# Patient Record
Sex: Female | Born: 1950 | Race: White | Hispanic: No | Marital: Married | Smoking: Never smoker
Health system: Southern US, Community
[De-identification: ages and names within clinical notes are randomized; demographics above are authoritative.]

## PROBLEM LIST (undated history)

## (undated) DIAGNOSIS — M1990 Unspecified osteoarthritis, unspecified site: Secondary | ICD-10-CM

## (undated) DIAGNOSIS — F32A Depression, unspecified: Secondary | ICD-10-CM

## (undated) DIAGNOSIS — F329 Major depressive disorder, single episode, unspecified: Secondary | ICD-10-CM

## (undated) DIAGNOSIS — D649 Anemia, unspecified: Secondary | ICD-10-CM

## (undated) DIAGNOSIS — I639 Cerebral infarction, unspecified: Secondary | ICD-10-CM

## (undated) DIAGNOSIS — R569 Unspecified convulsions: Secondary | ICD-10-CM

## (undated) DIAGNOSIS — K219 Gastro-esophageal reflux disease without esophagitis: Secondary | ICD-10-CM

## (undated) HISTORY — DX: Anemia, unspecified: D64.9

## (undated) HISTORY — DX: Depression, unspecified: F32.A

## (undated) HISTORY — DX: Cerebral infarction, unspecified: I63.9

## (undated) HISTORY — DX: Unspecified convulsions: R56.9

## (undated) HISTORY — DX: Gastro-esophageal reflux disease without esophagitis: K21.9

---
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
## 1998-05-11 ENCOUNTER — Ambulatory Visit (HOSPITAL_COMMUNITY): Admission: RE | Admit: 1998-05-11 | Discharge: 1998-05-11 | Payer: Self-pay | Admitting: Internal Medicine

## 1999-08-25 ENCOUNTER — Encounter: Payer: Self-pay | Admitting: Obstetrics and Gynecology

## 1999-08-25 ENCOUNTER — Encounter: Admission: RE | Admit: 1999-08-25 | Discharge: 1999-08-25 | Payer: Self-pay | Admitting: Obstetrics and Gynecology

## 1999-11-09 ENCOUNTER — Ambulatory Visit (HOSPITAL_COMMUNITY): Admission: RE | Admit: 1999-11-09 | Discharge: 1999-11-09 | Payer: Self-pay | Admitting: Obstetrics and Gynecology

## 2000-08-29 ENCOUNTER — Encounter: Admission: RE | Admit: 2000-08-29 | Discharge: 2000-08-29 | Payer: Self-pay | Admitting: Obstetrics and Gynecology

## 2000-08-29 ENCOUNTER — Encounter: Payer: Self-pay | Admitting: Obstetrics and Gynecology

## 2001-09-04 ENCOUNTER — Encounter: Admission: RE | Admit: 2001-09-04 | Discharge: 2001-09-04 | Payer: Self-pay | Admitting: Obstetrics and Gynecology

## 2001-09-04 ENCOUNTER — Encounter: Payer: Self-pay | Admitting: Obstetrics and Gynecology

## 2001-10-24 ENCOUNTER — Other Ambulatory Visit: Admission: RE | Admit: 2001-10-24 | Discharge: 2001-10-24 | Payer: Self-pay | Admitting: Obstetrics and Gynecology

## 2002-01-06 ENCOUNTER — Encounter: Admission: RE | Admit: 2002-01-06 | Discharge: 2002-04-06 | Payer: Self-pay | Admitting: Internal Medicine

## 2002-09-05 ENCOUNTER — Encounter: Payer: Self-pay | Admitting: Obstetrics and Gynecology

## 2002-09-05 ENCOUNTER — Encounter: Admission: RE | Admit: 2002-09-05 | Discharge: 2002-09-05 | Payer: Self-pay | Admitting: Obstetrics and Gynecology

## 2003-09-07 ENCOUNTER — Encounter: Admission: RE | Admit: 2003-09-07 | Discharge: 2003-09-07 | Payer: Self-pay | Admitting: Obstetrics and Gynecology

## 2004-09-20 ENCOUNTER — Encounter: Admission: RE | Admit: 2004-09-20 | Discharge: 2004-09-20 | Payer: Self-pay | Admitting: Obstetrics and Gynecology

## 2004-09-28 ENCOUNTER — Ambulatory Visit: Payer: Self-pay | Admitting: Internal Medicine

## 2004-10-07 ENCOUNTER — Ambulatory Visit: Payer: Self-pay | Admitting: Internal Medicine

## 2005-01-02 ENCOUNTER — Ambulatory Visit: Payer: Self-pay | Admitting: Internal Medicine

## 2005-09-21 ENCOUNTER — Encounter: Admission: RE | Admit: 2005-09-21 | Discharge: 2005-09-21 | Payer: Self-pay | Admitting: Obstetrics and Gynecology

## 2006-09-24 ENCOUNTER — Encounter: Admission: RE | Admit: 2006-09-24 | Discharge: 2006-09-24 | Payer: Self-pay | Admitting: Obstetrics and Gynecology

## 2007-10-18 ENCOUNTER — Encounter: Admission: RE | Admit: 2007-10-18 | Discharge: 2007-10-18 | Payer: Self-pay | Admitting: Obstetrics and Gynecology

## 2007-11-20 ENCOUNTER — Telehealth: Payer: Self-pay | Admitting: Internal Medicine

## 2007-11-28 ENCOUNTER — Telehealth (INDEPENDENT_AMBULATORY_CARE_PROVIDER_SITE_OTHER): Payer: Self-pay | Admitting: *Deleted

## 2007-12-02 ENCOUNTER — Ambulatory Visit: Payer: Self-pay | Admitting: Internal Medicine

## 2007-12-02 DIAGNOSIS — R635 Abnormal weight gain: Secondary | ICD-10-CM | POA: Insufficient documentation

## 2007-12-03 ENCOUNTER — Ambulatory Visit: Payer: Self-pay | Admitting: Internal Medicine

## 2007-12-05 LAB — CONVERTED CEMR LAB
ALT: 23 units/L (ref 0–35)
AST: 26 units/L (ref 0–37)
Albumin: 4.2 g/dL (ref 3.5–5.2)
Alkaline Phosphatase: 51 units/L (ref 39–117)
Basophils Absolute: 0 10*3/uL (ref 0.0–0.1)
Basophils Relative: 0.7 % (ref 0.0–1.0)
Bilirubin Urine: NEGATIVE
CO2: 31 meq/L (ref 19–32)
Eosinophils Absolute: 0.1 10*3/uL (ref 0.0–0.6)
Eosinophils Relative: 2.9 % (ref 0.0–5.0)
GFR calc non Af Amer: 91 mL/min
HDL: 74 mg/dL (ref >39.0)
Hemoglobin: 13.4 g/dL (ref 12.0–15.0)
LDL Cholesterol: 105 mg/dL — ABNORMAL HIGH (ref 0–99)
Lymphocytes Relative: 39 % (ref 12.0–46.0)
MCHC: 33.9 g/dL (ref 30.0–36.0)
Monocytes Absolute: 0.5 10*3/uL (ref 0.2–0.7)
Monocytes Relative: 9.6 % (ref 3.0–11.0)
Platelets: 258 10*3/uL (ref 150–400)
Potassium: 4.3 meq/L (ref 3.5–5.1)
TSH: 1.81 microintl units/mL (ref 0.35–5.50)
Total Bilirubin: 0.6 mg/dL (ref 0.3–1.2)
Total CHOL/HDL Ratio: 2.5
Total Protein: 6.6 g/dL (ref 6.0–8.3)
Triglycerides: 42 mg/dL (ref 0–149)
Urobilinogen, UA: 0.2 (ref 0.0–1.0)
VLDL: 8 mg/dL (ref 0–40)

## 2007-12-11 ENCOUNTER — Ambulatory Visit: Payer: Self-pay | Admitting: Gastroenterology

## 2007-12-15 HISTORY — PX: COLONOSCOPY: SHX174

## 2007-12-23 ENCOUNTER — Ambulatory Visit: Payer: Self-pay | Admitting: Gastroenterology

## 2007-12-23 DIAGNOSIS — K5730 Diverticulosis of large intestine without perforation or abscess without bleeding: Secondary | ICD-10-CM | POA: Insufficient documentation

## 2008-04-09 ENCOUNTER — Telehealth: Payer: Self-pay | Admitting: Internal Medicine

## 2008-04-10 ENCOUNTER — Ambulatory Visit: Payer: Self-pay | Admitting: Internal Medicine

## 2008-04-10 DIAGNOSIS — R5383 Other fatigue: Secondary | ICD-10-CM

## 2008-04-10 DIAGNOSIS — R5381 Other malaise: Secondary | ICD-10-CM | POA: Insufficient documentation

## 2008-04-10 DIAGNOSIS — G4700 Insomnia, unspecified: Secondary | ICD-10-CM | POA: Insufficient documentation

## 2008-07-27 ENCOUNTER — Ambulatory Visit: Payer: Self-pay | Admitting: Internal Medicine

## 2008-07-28 ENCOUNTER — Telehealth (INDEPENDENT_AMBULATORY_CARE_PROVIDER_SITE_OTHER): Payer: Self-pay | Admitting: *Deleted

## 2008-08-18 ENCOUNTER — Telehealth: Payer: Self-pay | Admitting: Internal Medicine

## 2008-08-31 ENCOUNTER — Ambulatory Visit: Payer: Self-pay | Admitting: Gastroenterology

## 2008-09-25 ENCOUNTER — Ambulatory Visit: Payer: Self-pay | Admitting: Gastroenterology

## 2008-09-25 ENCOUNTER — Encounter: Payer: Self-pay | Admitting: Gastroenterology

## 2008-10-01 ENCOUNTER — Encounter: Payer: Self-pay | Admitting: Gastroenterology

## 2008-10-02 ENCOUNTER — Encounter: Payer: Self-pay | Admitting: Gastroenterology

## 2008-10-12 ENCOUNTER — Ambulatory Visit: Payer: Self-pay | Admitting: Internal Medicine

## 2008-10-12 DIAGNOSIS — J019 Acute sinusitis, unspecified: Secondary | ICD-10-CM

## 2008-10-12 DIAGNOSIS — B009 Herpesviral infection, unspecified: Secondary | ICD-10-CM | POA: Insufficient documentation

## 2008-10-27 ENCOUNTER — Encounter: Admission: RE | Admit: 2008-10-27 | Discharge: 2008-10-27 | Payer: Self-pay | Admitting: Obstetrics and Gynecology

## 2008-11-18 ENCOUNTER — Telehealth: Payer: Self-pay | Admitting: Internal Medicine

## 2009-01-21 ENCOUNTER — Ambulatory Visit: Payer: Self-pay | Admitting: Gastroenterology

## 2009-01-21 ENCOUNTER — Ambulatory Visit: Payer: Self-pay | Admitting: Internal Medicine

## 2009-01-21 DIAGNOSIS — R195 Other fecal abnormalities: Secondary | ICD-10-CM

## 2009-01-21 DIAGNOSIS — K222 Esophageal obstruction: Secondary | ICD-10-CM

## 2009-01-21 LAB — CONVERTED CEMR LAB
Basophils Absolute: 0 10*3/uL (ref 0.0–0.1)
Eosinophils Absolute: 0.1 10*3/uL (ref 0.0–0.7)
GFR calc non Af Amer: 77.91 mL/min (ref >60)
Glucose, Bld: 108 mg/dL — ABNORMAL HIGH (ref 70–99)
Ketones, ur: NEGATIVE mg/dL
Leukocytes, UA: NEGATIVE
Lymphs Abs: 1.2 10*3/uL (ref 0.7–4.0)
MCHC: 33.9 g/dL (ref 30.0–36.0)
MCV: 95.5 fL (ref 78.0–100.0)
Monocytes Absolute: 0.4 10*3/uL (ref 0.1–1.0)
Platelets: 259 10*3/uL (ref 150.0–400.0)
Potassium: 4.6 meq/L (ref 3.5–5.1)
RDW: 12.5 % (ref 11.5–14.6)
Sodium: 143 meq/L (ref 135–145)
Specific Gravity, Urine: <=1.005 (ref 1.000–1.030)
Total Protein, Urine: NEGATIVE mg/dL
Urobilinogen, UA: 0.2 (ref 0.0–1.0)
WBC: 5.4 10*3/uL (ref 4.5–10.5)

## 2009-01-25 ENCOUNTER — Ambulatory Visit: Payer: Self-pay | Admitting: Internal Medicine

## 2009-01-25 DIAGNOSIS — L259 Unspecified contact dermatitis, unspecified cause: Secondary | ICD-10-CM | POA: Insufficient documentation

## 2009-03-24 ENCOUNTER — Telehealth: Payer: Self-pay | Admitting: Gastroenterology

## 2009-04-08 ENCOUNTER — Ambulatory Visit: Payer: Self-pay | Admitting: Internal Medicine

## 2009-04-08 DIAGNOSIS — J309 Allergic rhinitis, unspecified: Secondary | ICD-10-CM | POA: Insufficient documentation

## 2009-04-20 ENCOUNTER — Telehealth: Payer: Self-pay | Admitting: Internal Medicine

## 2009-04-20 ENCOUNTER — Encounter: Payer: Self-pay | Admitting: Internal Medicine

## 2009-04-22 ENCOUNTER — Encounter: Payer: Self-pay | Admitting: Gastroenterology

## 2009-06-09 ENCOUNTER — Encounter: Payer: Self-pay | Admitting: Internal Medicine

## 2009-07-14 ENCOUNTER — Encounter: Payer: Self-pay | Admitting: Internal Medicine

## 2009-10-05 ENCOUNTER — Telehealth (INDEPENDENT_AMBULATORY_CARE_PROVIDER_SITE_OTHER): Payer: Self-pay | Admitting: *Deleted

## 2009-10-28 ENCOUNTER — Encounter: Admission: RE | Admit: 2009-10-28 | Discharge: 2009-10-28 | Payer: Self-pay | Admitting: Obstetrics and Gynecology

## 2010-02-01 ENCOUNTER — Ambulatory Visit: Payer: Self-pay | Admitting: Internal Medicine

## 2010-02-01 DIAGNOSIS — F329 Major depressive disorder, single episode, unspecified: Secondary | ICD-10-CM

## 2010-02-01 DIAGNOSIS — M545 Low back pain: Secondary | ICD-10-CM

## 2010-04-21 ENCOUNTER — Ambulatory Visit: Payer: Self-pay | Admitting: Internal Medicine

## 2010-04-21 DIAGNOSIS — IMO0002 Reserved for concepts with insufficient information to code with codable children: Secondary | ICD-10-CM | POA: Insufficient documentation

## 2010-04-28 ENCOUNTER — Telehealth: Payer: Self-pay | Admitting: Internal Medicine

## 2010-08-30 ENCOUNTER — Encounter (INDEPENDENT_AMBULATORY_CARE_PROVIDER_SITE_OTHER): Payer: Self-pay | Admitting: *Deleted

## 2010-08-30 ENCOUNTER — Telehealth: Payer: Self-pay | Admitting: Internal Medicine

## 2010-10-05 ENCOUNTER — Ambulatory Visit: Payer: Self-pay | Admitting: Internal Medicine

## 2010-10-05 DIAGNOSIS — J069 Acute upper respiratory infection, unspecified: Secondary | ICD-10-CM | POA: Insufficient documentation

## 2010-10-07 ENCOUNTER — Telehealth: Payer: Self-pay | Admitting: Internal Medicine

## 2010-10-07 DIAGNOSIS — K219 Gastro-esophageal reflux disease without esophagitis: Secondary | ICD-10-CM | POA: Insufficient documentation

## 2010-10-07 DIAGNOSIS — R110 Nausea: Secondary | ICD-10-CM

## 2010-10-13 ENCOUNTER — Encounter
Admission: RE | Admit: 2010-10-13 | Discharge: 2010-10-13 | Payer: Self-pay | Source: Home / Self Care | Attending: Internal Medicine | Admitting: Internal Medicine

## 2010-10-19 ENCOUNTER — Telehealth: Payer: Self-pay | Admitting: Internal Medicine

## 2010-11-01 ENCOUNTER — Encounter
Admission: RE | Admit: 2010-11-01 | Discharge: 2010-11-01 | Payer: Self-pay | Source: Home / Self Care | Attending: Obstetrics and Gynecology | Admitting: Obstetrics and Gynecology

## 2010-11-17 NOTE — Progress Notes (Signed)
Summary: Cough med  Phone Note Other Incoming   Caller: pt  Summary of Call: Pt was seen on 12/21, she states she declined cough medication at that time but thinks that is what she needs. She states she needs something without codeine [facility] [address]. Please Advise Initial call taken by: Ami Bullins CMA,  October 19, 2010 1:18 PM  Follow-up for Phone Call        per AVP: called in Tusionex  take 5ml by mouth two times a day as needed  # with No Rf. Follow-up by: Lanier Prude, [facility]),  October 19, 2010 4:20 PM    New/Updated Medications: Sandria Senter ER 10-8 MG/5ML LQCR (HYDROCOD POLST-CHLORPHEN POLST) take 5 ml by mouth two times a day as needed cough Prescriptions: TUSSIONEX PENNKINETIC ER 10-8 MG/5ML LQCR (HYDROCOD POLST-CHLORPHEN POLST) take 5 ml by mouth two times a day as needed cough  #117ml x 0   Entered by:   Lanier Prude, CMA(AAMA)   Authorized by:   Tresa Garter MD   Signed by:   Lanier Prude, CMA(AAMA) on 10/19/2010   Method used:   [facility]phoned to ...       [facility]  [address] [id number]       [address]       [place], [place]  [address]       Ph: [phone] or [phone]       Fax: [phone]   RxID:   [id number]

## 2010-11-17 NOTE — Letter (Signed)
Summary: Generic Letter  [facility]  [address] [place], [place] [address]   Phone: [phone]  Fax: [phone]    04/21/2010  [facility] [address] [place], [place]  [address]  Dear Ms. Venkatesh,      Due to your recent injury to the right knee on Sunday  April 17, 2010,  you should work from home April 18, 2010 through  April 24, 2010, with return to the workplace April 25, 2010         Sincerely,   Oliver Barre MD

## 2010-11-17 NOTE — Assessment & Plan Note (Signed)
Summary: HURT BACK X 2 DYS AGO--STC   Vital Signs:  Patient profile:   60 year old female Height:      62 inches Weight:      180 pounds BMI:     33.04 O2 Sat:      95 % on Room air Temp:     98.3 degrees F oral Pulse rate:   79 / minute BP sitting:   130 / 72  (left arm) Cuff size:   regular  Vitals Entered By: Lucious Groves (February 01, 2010 2:09 PM)  O2 Flow:  Room air CC: Back pain x2 days, pt is in process of remodeling her home./kb Is Patient Diabetic? No Pain Assessment Patient in pain? yes     Location: back Intensity: 6 Type: weakness   Primary Care Provider:  Sonda Primes, MD  CC:  Back pain x2 days and pt is in process of remodeling her home./kb.  History of Present Illness: C/o depression and wt gain F/u allergies C/o LBP  Allergies (verified): No Known Drug Allergies  Past History:  Social History: Last updated: 01/21/2009 Occupation: [employer] Married Never Smoked Alcohol Use - yes -1 daily Illicit Drug Use - no Daily Caffeine Use Patient does not get regular exercise. - 2 cups daily  Past Medical History: Current Problems:  DIVERTICULOSIS, COLON (ICD-562.10) DYSPHAGIA UNSPECIFIED (ICD-787.20) INSOMNIA, PERSISTENT (ICD-307.42) FATIGUE (ICD-780.79) WELL ADULT EXAM (ICD-V70.0) WEIGHT GAIN (ICD-783.1) Depression Low back pain 2011  Family History: Aunt breast ca M kidney CA 73 No FH of Colon Cancer: Maternal grandmother-Kidney Cancer in [place]  Social History: Reviewed history from 01/21/2009 and no changes required. Occupation: [employer] Married Never Smoked Alcohol Use - yes -1 daily Illicit Drug Use - no Daily Caffeine Use Patient does not get regular exercise. - 2 cups daily  Physical Exam  General:  alert, well-developed, well-nourished, and cooperative to examination.    Ears:  normal pinnae bilaterally, without erythema, swelling, or tenderness to palpation. TMs clear, R without effusion, L with serous effusion. No  erythema, and no cerumen impaction. Hearing grossly normal bilaterally  Nose:  no nasal discharge, mucosal pallor or mucosal erythema.   Mouth:  teeth and gums in good repair; mucous membranes moist, without lesions or ulcers. oropharynx clear without exudate, erythema. postnasal drip noted Abdomen:  soft non-tender, normal BS,no distention, no organomegalies, no masses Msk:  No deformity or scoliosis noted of thoracic or lumbar spine.   Extremities:  no edema, no ulcers  Neurologic:  alert & oriented X3 and cranial nerves II-XII symetrically intact.  strength normal in all extremities, sensation intact to light touch, and gait normal. speech fluent without dysarthria or aphasia; follows commands with good comprehension.  Skin:  very tan from recent sun exposure. No generalized rash or hives Psych:  Oriented X3, memory intact for recent and remote, normally interactive, good eye contact, not anxious appearing, not depressed appearing, and not agitated.   not suicidal.     Impression & Recommendations:  Problem # 1:  WEIGHT GAIN (ICD-783.1) Assessment Deteriorated Diet discussed  Problem # 2:  FATIGUE (ICD-780.79) due to #3 Assessment: Deteriorated  Problem # 3:  DEPRESSION (ICD-311) Assessment: Deteriorated  The following medications were removed from the medication list:    Fluoxetine Hcl 10 Mg Tabs (Fluoxetine hcl) .Marland Kitchen... Take 1 tablet by mouth once a day Her updated medication list for this problem includes:    Wellbutrin Sr 150 Mg Xr12h-tab (Bupropion hcl) .Marland Kitchen... 1 by mouth q am and  q lunch (two times a day)  Problem # 4:  LOW BACK PAIN (ICD-724.2) Assessment: Deteriorated  Her updated medication list for this problem includes:    Ibuprofen 600 Mg Tabs (Ibuprofen) .Marland Kitchen... 1 by mouth bid  pc x 1 wk then as needed for  pain    Tramadol Hcl 50 Mg Tabs (Tramadol hcl) .Marland Kitchen... 1-2 tabs by mouth two times a day as needed pain  Complete Medication List: 1)  Caltrate 600 1500 Mg Tabs  (Calcium carbonate) .... Take 1 tablet by mouth once a day 2)  Pantoprazole Sodium 40 Mg Tbec (Pantoprazole sodium) .... One tablet by mouth once daily 3)  Topicort 0.25 % Crea (Desoximetasone) .... Use two times a day as needed 4)  Adipex-p 37.5 Mg Tabs (Phentermine hcl) .Marland Kitchen.. 1 by mouth q am 5)  Nasonex 50 Mcg/act Susp (Mometasone furoate) .Marland Kitchen.. 1 spray each nostril each am 6)  Claritin 10 Mg Tabs (Loratadine) .Marland Kitchen.. 1 by mouth once daily 7)  Tessalon Perles 100 Mg Caps (Benzonatate) .Marland Kitchen.. 1 by mouth three times a day as needed cough 8)  Wellbutrin Sr 150 Mg Xr12h-tab (Bupropion hcl) .Marland Kitchen.. 1 by mouth q am and q lunch (two times a day) 9)  Vitamin D 1000 Unit Tabs (Cholecalciferol) .Marland Kitchen.. 1 by mouth qd 10)  Ibuprofen 600 Mg Tabs (Ibuprofen) .Marland Kitchen.. 1 by mouth bid  pc x 1 wk then as needed for  pain 11)  Tramadol Hcl 50 Mg Tabs (Tramadol hcl) .Marland Kitchen.. 1-2 tabs by mouth two times a day as needed pain  Patient Instructions: 1)  Please schedule a follow-up appointment in 2 months. 2)  Use stretching and balance exercises that I have provided (15 min. or longer every day)  Prescriptions: TRAMADOL HCL 50 MG TABS (TRAMADOL HCL) 1-2 tabs by mouth two times a day as needed pain  #120 x 1   Entered and Authorized by:   Tresa Garter MD   Signed by:   Tresa Garter MD on 02/01/2010   Method used:   [facility]        [facility]  [address] [id number]       [address]       [place], [place]  [address]       Ph: [phone] or [phone]       Fax: [phone]   RxID:   [id number] TRAMADOL HCL 50 MG TABS (TRAMADOL HCL) 1-2 tabs by mouth two times a day as needed pain  #120 x 1   Entered and Authorized by:   Tresa Garter MD   Signed by:   Tresa Garter MD on 02/01/2010   Method used:   Print then Give to Patient   RxID:   [id number] IBUPROFEN 600 MG TABS (IBUPROFEN) 1 by mouth bid  pc x 1 wk then as needed for  pain  #60 x 3   Entered and  Authorized by:   Tresa Garter MD   Signed by:   Tresa Garter MD on 02/01/2010   Method used:   [facility]        [facility]  [address] [id number]       [address]       [place], [place]  [address]       Ph: [phone] or [phone]       Fax: [phone]   RxID:   [id number]  WELLBUTRIN SR 150 MG XR12H-TAB (BUPROPION HCL) 1 by mouth q am and q lunch (two times a day)  #60 x 6   Entered and Authorized by:   Tresa Garter MD   Signed by:   Tresa Garter MD on 02/01/2010   Method used:   [facility]        [facility]  [address] [id number]       [address]       [place], [place]  [address]       Ph: [phone] or [phone]       Fax: [phone]   RxID:   [id number]

## 2010-11-17 NOTE — Assessment & Plan Note (Signed)
Summary: KNEE PAIN/NWS   Vital Signs:  Patient profile:   60 year old female Height:      62 inches Weight:      176.75 pounds BMI:     32.44 O2 Sat:      97 % on Room air Temp:     98.4 degrees F oral Pulse rate:   71 / minute BP sitting:   138 / 88  (left arm) Cuff size:   regular  Vitals Entered By: Zella Ball Ewing CMA [facility]) (April 21, 2010 3:36 PM)  O2 Flow:  Room air CC: Right knee pain/RE   Primary Care Provider:  Sonda Primes, MD  CC:  Right knee pain/RE.  History of Present Illness: here after twisting the right knee after a mistep going down a stair 3 to 4 days ago;  pain is mild to mod, lateral and medial, kneecap feels "funny" like its out of place, not sure about swelling to theknee, but no swelling below the knee;  no sense of instability or further falls or injury, no fever, hx of gout;  worried b/c husband has surgury on his knee;  no  prior surgury , no recent MRI, PT or ortho MD in the past.  Pain worse to standing, walking and hard to sleep at night when turning over.   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 Take 1 Tablet By Mouth Once A Day 2)  Pantoprazole Sodium 40 Mg Tbec (Pantoprazole Sodium) .... One Tablet By Mouth Once Daily 3)  Topicort 0.25 % Crea (Desoximetasone) .... Use Two Times A Day As Needed 4)  Adipex-P 37.5 Mg Tabs  (Phentermine Hcl) .Marland Kitchen.. 1 By Mouth Q Am 5)  Nasonex 50 Mcg/act Susp (Mometasone Furoate) .Marland Kitchen.. 1 Spray Each Nostril Each Am 6)  Claritin 10 Mg Tabs (Loratadine) .Marland Kitchen.. 1 By Mouth Once Daily 7)  Tessalon Perles 100 Mg Caps (Benzonatate) .Marland Kitchen.. 1 By Mouth Three Times A Day As Needed Cough 8)  Wellbutrin Sr 150 Mg Xr12h-Tab (Bupropion Hcl) .Marland Kitchen.. 1 By Mouth Q Am and Q Lunch (Two Times A Day) 9)  Vitamin D 1000 Unit Tabs (Cholecalciferol) .Marland Kitchen.. 1 By Mouth Qd 10)  Ibuprofen 600 Mg Tabs (Ibuprofen) .Marland Kitchen.. 1 By Mouth Bid  Pc X 1 Wk Then As Needed For  Pain 11)  Tramadol Hcl 50 Mg Tabs (Tramadol Hcl) .Marland Kitchen.. 1-2 Tabs By Mouth Two Times A Day As Needed Pain  Current Medications (verified): 1)  Caltrate 600 1500 Mg Tabs (Calcium Carbonate) .... Take 1 Tablet By Mouth Once A Day 2)  Pantoprazole Sodium 40 Mg Tbec (Pantoprazole Sodium) .... One Tablet By Mouth Once Daily 3)  Topicort 0.25 % Crea (Desoximetasone) .... Use Two Times A Day As Needed 4)  Adipex-P 37.5 Mg Tabs (Phentermine Hcl) .Marland Kitchen.. 1 By Mouth Q Am 5)  Nasonex 50 Mcg/act Susp (Mometasone Furoate) .Marland KitchenMarland KitchenMarland Kitchen  1 Spray Each Nostril Each Am 6)  Claritin 10 Mg Tabs (Loratadine) .Marland Kitchen.. 1 By Mouth Once Daily 7)  Tessalon Perles 100 Mg Caps (Benzonatate) .Marland Kitchen.. 1 By Mouth Three Times A Day As Needed Cough 8)  Wellbutrin Sr 150 Mg Xr12h-Tab (Bupropion Hcl) .Marland Kitchen.. 1 By Mouth Q Am and Q Lunch (Two Times A Day) 9)  Vitamin D 1000 Unit Tabs (Cholecalciferol) .Marland Kitchen.. 1 By Mouth Qd 10)  Ibuprofen 600 Mg Tabs (Ibuprofen) .Marland Kitchen.. 1 By Mouth Bid  Pc X 1 Wk Then As Needed For  Pain 11)  Tramadol Hcl 50 Mg Tabs (Tramadol Hcl) .Marland Kitchen.. 1-2 Tabs By Mouth Two Times A Day As Needed Pain  Allergies (verified): No Known Drug Allergies  Past History:  Past Medical History: Last updated: 02/01/2010 Current Problems:  DIVERTICULOSIS, COLON (ICD-562.10) DYSPHAGIA UNSPECIFIED (ICD-787.20) INSOMNIA, PERSISTENT (ICD-307.42) FATIGUE (ICD-780.79) WELL ADULT EXAM (ICD-V70.0) WEIGHT GAIN  (ICD-783.1) Depression Low back pain 2011  Past Surgical History: Last updated: 08/31/2008 Unremarkable  Social History: Last updated: 01/21/2009 Occupation: [employer] Married Never Smoked Alcohol Use - yes -1 daily Illicit Drug Use - no Daily Caffeine Use Patient does not get regular exercise. - 2 cups daily  Risk Factors: Exercise: no (01/21/2009)  Risk Factors: Smoking Status: never (12/02/2007)  Review of Systems       all otherwise negative per pt -    Physical Exam  General:  alert and overweight-appearing.   Head:  normocephalic and atraumatic.   Eyes:  vision grossly intact, pupils equal, and pupils round.   Ears:  R ear normal and L ear normal.   Nose:  no external deformity and no nasal discharge.   Mouth:  no gingival abnormalities and pharynx pink and moist.   Neck:  supple and no masses.   Lungs:  normal respiratory effort and normal breath sounds.   Heart:  normal rate and regular rhythm.   Msk:  right knee with mild medial aspect swelling, tender and warmth, trace warmth laterally as well without tender or swelling;  patella nontender; knee with FROM, NT, but with mild crepitus Extremities:  no edema, no erythema  Neurologic:  strength normal in all extremities and gait normal.     Impression & Recommendations:  Problem # 1:  KNEE SPRAIN, RIGHT (ICD-844.9) with medial puffiness, and o/w mild knee crepitus; ok for note to work from home for 1 wk;  d/w pt - ok to hold on imaging at this time, for ibuprofen as needed, f/u any worsening or persistent pain but should improve over the next 1-2 wks  Complete Medication List: 1)  Caltrate 600 1500 Mg Tabs (Calcium carbonate) .... Take 1 tablet by mouth once a day 2)  Pantoprazole Sodium 40 Mg Tbec (Pantoprazole sodium) .... One tablet by mouth once daily 3)  Topicort 0.25 % Crea (Desoximetasone) .... Use two times a day as needed 4)  Adipex-p 37.5 Mg Tabs (Phentermine hcl) .Marland Kitchen.. 1 by mouth q am 5)   Nasonex 50 Mcg/act Susp (Mometasone furoate) .Marland Kitchen.. 1 spray each nostril each am 6)  Claritin 10 Mg Tabs (Loratadine) .Marland Kitchen.. 1 by mouth once daily 7)  Tessalon Perles 100 Mg Caps (Benzonatate) .Marland Kitchen.. 1 by mouth three times a day as needed cough 8)  Wellbutrin Sr 150 Mg Xr12h-tab (Bupropion hcl) .Marland Kitchen.. 1 by mouth q am and q lunch (two times a day) 9)  Vitamin D 1000 Unit Tabs (Cholecalciferol) .Marland Kitchen.. 1 by mouth qd 10)  Ibuprofen 600 Mg Tabs (Ibuprofen) .Marland KitchenMarland KitchenMarland Kitchen 1  by mouth bid  pc x 1 wk then as needed for  pain 11)  Tramadol Hcl 50 Mg Tabs (Tramadol hcl) .Marland Kitchen.. 1-2 tabs by mouth two times a day as needed pain  Patient Instructions: 1)  Please take all new medications as prescribed 2)  Continue all previous medications as before this visit  3)  you are given the work note today 4)  Please schedule an appointment with your primary doctor as needed Prescriptions: IBUPROFEN 600 MG TABS (IBUPROFEN) 1 by mouth bid  pc x 1 wk then as needed for  pain  #60 x 1   Entered and Authorized by:   Corwin Levins MD   Signed by:   Corwin Levins MD on 04/21/2010   Method used:   Print then Give to Patient   RxID:   [id number]

## 2010-11-17 NOTE — Progress Notes (Signed)
Summary: Rf Fluoxetine  Phone Note Outgoing Call   Call placed by: Misty Stanley Call placed to: Patient Summary of Call: lmctb re: [facility].......rf request for fluoxetine 100mg .  Per pt's med list pt is not taking.  Need to verify with her if she truly needs RF or to disregard. Initial call taken by: Lanier Prude, [facility]),  April 28, 2010 3:37 PM  Follow-up for Phone Call        spoke to pt. She states she is currently taking gen Wellbutrin and does not need this medication. Will disregard fax. Follow-up by: Lanier Prude, [facility]),  April 28, 2010 3:47 PM

## 2010-11-17 NOTE — Progress Notes (Signed)
Summary: u/s?   Phone Note Call from Patient Call back at Home Phone [phone]   Summary of Call: Pt's mother and grandmother both died from kidney cancer. Per pt, mother had symptoms of indigestion (same as pt has) before she was dx w/cancer. Pt's sister thinks she should have a renal u/s. Please advise.  Initial call taken by: Lamar Sprinkles, CMA,  October 07, 2010 1:53 PM  Follow-up for Phone Call        ok abd [place] Follow-up by: Tresa Garter MD,  October 07, 2010 4:25 PM  Additional Follow-up for Phone Call Additional follow up Details #1::        Pt informed  Additional Follow-up by: Lamar Sprinkles, CMA,  October 11, 2010 10:14 AM  New Problems: GERD (ICD-530.81) NAUSEA (ICD-787.02)   New Problems: GERD (ICD-530.81) NAUSEA (ICD-787.02)

## 2010-11-17 NOTE — Assessment & Plan Note (Signed)
Summary: cold,cough/cd   Vital Signs:  Patient profile:   60 year old female Height:      62 inches Weight:      183 pounds BMI:     33.59 Temp:     99.2 degrees F oral Pulse rate:   84 / minute Pulse rhythm:   regular Resp:     16 per minute BP sitting:   136 / 80  (left arm) Cuff size:   regular  Vitals Entered By: Lanier Prude, Beverly Gust) (October 05, 2010 9:43 AM) CC: congestion, Rt ear stopped up, sneezing, fever X 1 day Is Patient Diabetic? No   Primary Care Provider:  Sonda Primes, MD  CC:  congestion, Rt ear stopped up, sneezing, and fever X 1 day.  History of Present Illness: The patient presents with complaints of sore throat, fever, cough, sinus congestion and drainge of several days duration. Not better with OTC meds. Chest hurts with coughing. Can't sleep due to cough. Muscle aches are present.  The mucus is colored.  C/o cold sores  C/o stress  Current Medications (verified): 1)  Caltrate 600 1500 Mg Tabs (Calcium Carbonate) .... Take 1 Tablet By Mouth Once A Day 2)  Pantoprazole Sodium 40 Mg Tbec (Pantoprazole Sodium) .... One Tablet By Mouth Once Daily 3)  Topicort 0.25 % Crea (Desoximetasone) .... Use Two Times A Day As Needed 4)  Adipex-P 37.5 Mg Tabs (Phentermine Hcl) .Marland Kitchen.. 1 By Mouth Q Am 5)  Nasonex 50 Mcg/act Susp (Mometasone Furoate) .Marland Kitchen.. 1 Spray Each Nostril Each Am 6)  Claritin 10 Mg Tabs (Loratadine) .Marland Kitchen.. 1 By Mouth Once Daily 7)  Tessalon Perles 100 Mg Caps (Benzonatate) .Marland Kitchen.. 1 By Mouth Three Times A Day As Needed Cough 8)  Wellbutrin Sr 150 Mg Xr12h-Tab (Bupropion Hcl) .Marland Kitchen.. 1 By Mouth Q Am and Q Lunch (Two Times A Day) 9)  Vitamin D 1000 Unit Tabs (Cholecalciferol) .Marland Kitchen.. 1 By Mouth Qd 10)  Ibuprofen 600 Mg Tabs (Ibuprofen) .Marland Kitchen.. 1 By Mouth Bid  Pc X 1 Wk Then As Needed For  Pain 11)  Tramadol Hcl 50 Mg Tabs (Tramadol Hcl) .Marland Kitchen.. 1-2 Tabs By Mouth Two Times A Day As Needed Pain  Allergies (verified): No Known Drug Allergies  Past  History:  Past Medical History: Last updated: 02/01/2010 Current Problems:  DIVERTICULOSIS, COLON (ICD-562.10) DYSPHAGIA UNSPECIFIED (ICD-787.20) INSOMNIA, PERSISTENT (ICD-307.42) FATIGUE (ICD-780.79) WELL ADULT EXAM (ICD-V70.0) WEIGHT GAIN (ICD-783.1) Depression Low back pain 2011  Social History: Occupation: [employer] Married Never Smoked Alcohol Use - yes -1 daily Illicit Drug Use - no Daily Caffeine Use 2 cups Patient does not get regular exercise  Physical Exam  General:  alert and overweight-appearing.   Ears:  R ear normal and L ear normal.   Nose:  no external deformity and no nasal discharge.   Mouth:  no gingival abnormalities and pharynx pink and moist.   Erythematous throat and intranasal mucosa c/w URI  Lungs:  normal respiratory effort and normal breath sounds.   Heart:  normal rate and regular rhythm.   Abdomen:  soft non-tender, normal BS,no distention, no organomegalies, no masses Msk:  right knee with mild medial aspect swelling, tender and warmth, trace warmth laterally as well without tender or swelling;  patella nontender; knee with FROM, NT, but with mild crepitus Extremities:  no edema, no erythema  Neurologic:  strength normal in all extremities and gait normal.   Skin:  very tan from recent sun exposure. No generalized rash or hives  Psych:  Oriented X3, memory intact for recent and remote, normally interactive, good eye contact, not anxious appearing, not depressed appearing, and not agitated.   not suicidal.     Impression & Recommendations:  Problem # 1:  UPPER RESPIRATORY INFECTION, ACUTE (ICD-465.9) Assessment New  Her updated medication list for this problem includes:    Claritin 10 Mg Tabs (Loratadine) .Marland Kitchen... 1 by mouth once daily    Tessalon Perles 100 Mg Caps (Benzonatate) .Marland Kitchen... 1 by mouth three times a day as needed cough    Ibuprofen 600 Mg Tabs (Ibuprofen) .Marland Kitchen... 1 by mouth bid  pc x 1 wk then as needed for  pain  Problem # 2:   HERPES LABIALIS (ICD-054.9) Assessment: New Acyclovir  Problem # 3:  DEPRESSION (ICD-311) Assessment: Deteriorated Stress discussed Her updated medication list for this problem includes:    Wellbutrin Sr 150 Mg Xr12h-tab (Bupropion hcl) .Marland Kitchen... 1 by mouth q am and q lunch (two times a day)  Problem # 4:  ALLERGIC RHINITIS (ICD-477.9) Assessment: Deteriorated  Her updated medication list for this problem includes:    Nasonex 50 Mcg/act Susp (Mometasone furoate) .Marland Kitchen... 1 spray each nostril each am    Claritin 10 Mg Tabs (Loratadine) .Marland Kitchen... 1 by mouth once daily    Sudafed 12 Hour 120 Mg Xr12h-tab (Pseudoephedrine hcl) .Marland Kitchen... 1 by mouth two times a day as needed allergies  Complete Medication List: 1)  Caltrate 600 1500 Mg Tabs (Calcium carbonate) .... Take 1 tablet by mouth once a day 2)  Pantoprazole Sodium 40 Mg Tbec (Pantoprazole sodium) .... One tablet by mouth once daily 3)  Topicort 0.25 % Crea (Desoximetasone) .... Use two times a day as needed 4)  Adipex-p 37.5 Mg Tabs (Phentermine hcl) .Marland Kitchen.. 1 by mouth q am 5)  Nasonex 50 Mcg/act Susp (Mometasone furoate) .Marland Kitchen.. 1 spray each nostril each am 6)  Claritin 10 Mg Tabs (Loratadine) .Marland Kitchen.. 1 by mouth once daily 7)  Tessalon Perles 100 Mg Caps (Benzonatate) .Marland Kitchen.. 1 by mouth three times a day as needed cough 8)  Wellbutrin Sr 150 Mg Xr12h-tab (Bupropion hcl) .Marland Kitchen.. 1 by mouth q am and q lunch (two times a day) 9)  Vitamin D 1000 Unit Tabs (Cholecalciferol) .Marland Kitchen.. 1 by mouth qd 10)  Ibuprofen 600 Mg Tabs (Ibuprofen) .Marland Kitchen.. 1 by mouth bid  pc x 1 wk then as needed for  pain 11)  Tramadol Hcl 50 Mg Tabs (Tramadol hcl) .Marland Kitchen.. 1-2 tabs by mouth two times a day as needed pain 12)  Zithromax Z-pak 250 Mg Tabs (Azithromycin) .... As dirrected 13)  Acyclovir 400 Mg Tabs (Acyclovir) .Marland Kitchen.. 1 by mouth three times a day as needed herpes x 7 d 14)  Sudafed 12 Hour 120 Mg Xr12h-tab (Pseudoephedrine hcl) .Marland Kitchen.. 1 by mouth two times a day as needed allergies  Patient  Instructions: 1)  Use over-the-counter medicines for "cold": Tylenol  650mg  or Advil 400mg  every 6 hours  for fever; Delsym or Robutussin for cough. Mucinex  for congestion. Ricola or Halls for sore throat. Office visit if not better or if worse.   Prescriptions: SUDAFED 12 HOUR 120 MG XR12H-TAB (PSEUDOEPHEDRINE HCL) 1 by mouth two times a day as needed allergies  #60 x 1   Entered and Authorized by:   Tresa Garter MD   Signed by:   Tresa Garter MD on 10/05/2010   Method used:   Electronically to        [facility]  [address] [id number] (  [facility]       [address]       [place], [place]  [address]       Ph: [phone] or [phone]       Fax: [phone]   RxID:   [id number] ACYCLOVIR 400 MG TABS (ACYCLOVIR) 1 by mouth three times a day as needed herpes x 7 d  #21 x 3   Entered and Authorized by:   Tresa Garter MD   Signed by:   Tresa Garter MD on 10/05/2010   Method used:   [facility]ronically to        [facility]  [address] [id number] ([facility]       [address]       [place], [place]  [address]       Ph: [phone] or [phone]       Fax: [phone]   RxID:   [id number] ZITHROMAX Z-PAK 250 MG TABS (AZITHROMYCIN) as dirrected  #1 x 0   Entered and Authorized by:   Tresa Garter MD   Signed by:   Tresa Garter MD on 10/05/2010   Method used:   [facility]        [facility]  [address] [id number]       [address]       [place], [place]  [address]       Ph: [phone] or [phone]       Fax: [phone]   RxID:   [id number]    Orders Added: 1)  Est. Patient Level IV [[address]]

## 2010-11-17 NOTE — Letter (Signed)
Summary: Primary Care Appointment Letter  [facility]  [address] [place], [place] [address]   Phone: [phone]  Fax: [phone]    08/30/2010 MRN: [id number]  [facility] [address] [place], [place]  [address]  Dear Ms. Trew,   Your Primary Care Physician Tresa Garter MD has indicated that:    ____X___it is time to schedule an appointment with Dr Posey Rea.    _______you missed your appointment on______ and need to call and          reschedule.    _______you need to have lab work done.    _______you need to schedule an appointment discuss lab or test results.    _______you need to call to reschedule your appointment that is                       scheduled on _________.     Please call our office as soon as possible. Our phone number is [phone]. Please press option 1. Our office is open 8a-12noon and 1p-5p, Monday through Friday.     Thank you,    [facility]

## 2010-11-17 NOTE — Progress Notes (Signed)
Summary: Needs OV  ---- Converted from flag ---- ---- 08/30/2010 10:42 AM, Verdell Face wrote: left pt 2 messages and mailed letter to call and sch appt  ---- 08/24/2010 3:06 PM, Verdell Face wrote: left message to vm to cb to sched appt/cd    ---- 08/24/2010 1:56 PM, Lanier Prude, CMA(AAMA) wrote: Please sched pt for OV.  She was due back in 03/2010.   Thanks! ------------------------------

## 2010-11-17 NOTE — Progress Notes (Signed)
Summary: ALT RX?   Phone Note Call from Patient Call back at [facility] Phone [phone]   Summary of Call: Pt started zpak on 21st, she does not feel any better. Pt req alt antibiotic.  Initial call taken by: Lamar Sprinkles, CMA,  October 07, 2010 11:49 AM  Follow-up for Phone Call        ok ceftin Follow-up by: Tresa Garter MD,  October 07, 2010 1:24 PM  Additional Follow-up for Phone Call Additional follow up Details #1::        Pt advised Additional Follow-up by: Margaret Pyle, CMA,  October 07, 2010 1:29 PM    New/Updated Medications: CEFTIN 500 MG TABS (CEFUROXIME AXETIL) 1 by mouth bid Prescriptions: CEFTIN 500 MG TABS (CEFUROXIME AXETIL) 1 by mouth bid  #20 x 1   Entered and Authorized by:   Tresa Garter MD   Signed by:   Margaret Pyle, CMA on 10/07/2010   Method used:   [facility]        [facility]  [address] [id number]       [address]       [place], [place]  [address]       Ph: [phone] or [phone]       Fax: [phone]   RxID:   [id number]

## 2011-02-03 ENCOUNTER — Telehealth: Payer: Self-pay | Admitting: Internal Medicine

## 2011-02-03 NOTE — Telephone Encounter (Signed)
Pt states that she was seen by Opthalmology for "some type of hemmorhage in eye" [she doesn't remember]. Optho feels like this could be related to BP and is wanting her to be seen by PCP. Please advise.

## 2011-02-07 NOTE — Telephone Encounter (Signed)
OV on Thur next wk - I will unblock my schedule on Thu and Fri next wk (May 3 and 4) 

## 2011-02-08 NOTE — Telephone Encounter (Signed)
Printed response from MD and forwarded to scheduler/Nancy.

## 2011-04-15 ENCOUNTER — Other Ambulatory Visit: Payer: Self-pay | Admitting: Gastroenterology

## 2011-06-06 ENCOUNTER — Other Ambulatory Visit: Payer: Self-pay | Admitting: Internal Medicine

## 2011-06-23 ENCOUNTER — Other Ambulatory Visit: Payer: Self-pay | Admitting: Internal Medicine

## 2011-07-17 ENCOUNTER — Other Ambulatory Visit: Payer: Self-pay | Admitting: Internal Medicine

## 2011-07-17 ENCOUNTER — Ambulatory Visit (INDEPENDENT_AMBULATORY_CARE_PROVIDER_SITE_OTHER): Payer: 59 | Admitting: *Deleted

## 2011-07-17 DIAGNOSIS — Z23 Encounter for immunization: Secondary | ICD-10-CM

## 2011-09-07 ENCOUNTER — Other Ambulatory Visit: Payer: Self-pay | Admitting: Internal Medicine

## 2011-09-07 ENCOUNTER — Other Ambulatory Visit: Payer: Self-pay | Admitting: Gastroenterology

## 2011-09-13 ENCOUNTER — Other Ambulatory Visit: Payer: Self-pay | Admitting: Gastroenterology

## 2011-09-13 MED ORDER — PANTOPRAZOLE SODIUM 40 MG PO TBEC: 40.0000 mg | DELAYED_RELEASE_TABLET | Freq: Every day | ORAL | Status: DC

## 2011-09-24 ENCOUNTER — Other Ambulatory Visit: Payer: Self-pay | Admitting: Gastroenterology

## 2011-11-06 ENCOUNTER — Other Ambulatory Visit: Payer: Self-pay | Admitting: *Deleted

## 2011-11-06 ENCOUNTER — Other Ambulatory Visit: Payer: Self-pay

## 2011-11-06 MED ORDER — PSEUDOEPHEDRINE HCL ER 120 MG PO TB12: 120.0000 mg | ORAL_TABLET | Freq: Two times a day (BID) | ORAL | Status: DC

## 2011-11-06 MED ORDER — AZITHROMYCIN 250 MG PO TABS: ORAL_TABLET | ORAL | Status: AC

## 2011-11-06 NOTE — Telephone Encounter (Signed)
Ok for z-pak. If symptoms don't clear - OV

## 2011-11-06 NOTE — Telephone Encounter (Signed)
Patient has sinus infection and would like a Z pac sent to pharmacy, please advise  [phone]

## 2011-11-06 NOTE — Telephone Encounter (Signed)
Pt called back to see if Dr. Debby Bud would fill Zpak in AVP's absence. She c/o runny nose and watery eyes. Please advise

## 2011-11-06 NOTE — Telephone Encounter (Signed)
Done. Pt informed.

## 2011-11-10 ENCOUNTER — Other Ambulatory Visit: Payer: Self-pay | Admitting: Internal Medicine

## 2011-11-13 ENCOUNTER — Other Ambulatory Visit: Payer: Self-pay | Admitting: *Deleted

## 2011-11-13 MED ORDER — BUPROPION HCL ER (SR) 150 MG PO TB12: 150.0000 mg | ORAL_TABLET | Freq: Two times a day (BID) | ORAL | Status: DC

## 2011-11-13 NOTE — Telephone Encounter (Signed)
Azithromycin requested: see previous note;  Lanier Prude, [facility] 11/06/2011 5:20 PM Signed  Done. Pt informed  Illene Regulus, MD 11/06/2011 5:18 PM Signed  Ok for z-pak. If symptoms don't clear - OV Lanier Prude, [facility] 11/06/2011 3:22 PM Signed  Pt called back to see if Dr. Debby Bud would fill Zpak in AVP's absence. She c/o runny nose and watery eyes. Please advise Zella Ball Ewing, MA 11/06/2011 11:49 AM Signed  Patient has sinus infection and would like a Z pac sent to pharmacy, please advise [phone]

## 2011-11-15 ENCOUNTER — Other Ambulatory Visit: Payer: Self-pay | Admitting: Obstetrics and Gynecology

## 2011-11-15 DIAGNOSIS — Z1231 Encounter for screening mammogram for malignant neoplasm of breast: Secondary | ICD-10-CM

## 2011-11-28 ENCOUNTER — Ambulatory Visit
Admission: RE | Admit: 2011-11-28 | Discharge: 2011-11-28 | Disposition: A | Discharge status: Home / Self Care | Payer: 59 | Source: Ambulatory Visit | Attending: Obstetrics and Gynecology | Admitting: Obstetrics and Gynecology

## 2011-11-28 DIAGNOSIS — Z1231 Encounter for screening mammogram for malignant neoplasm of breast: Secondary | ICD-10-CM

## 2012-01-03 ENCOUNTER — Ambulatory Visit (INDEPENDENT_AMBULATORY_CARE_PROVIDER_SITE_OTHER): Payer: 59 | Admitting: Internal Medicine

## 2012-01-03 ENCOUNTER — Encounter: Payer: Self-pay | Admitting: Internal Medicine

## 2012-01-03 VITALS — BP 130/90 | HR 80 | Temp 97.5°F | Resp 16 | Wt 171.0 lb

## 2012-01-03 DIAGNOSIS — K222 Esophageal obstruction: Secondary | ICD-10-CM

## 2012-01-03 DIAGNOSIS — Z01818 Encounter for other preprocedural examination: Secondary | ICD-10-CM

## 2012-01-03 DIAGNOSIS — K219 Gastro-esophageal reflux disease without esophagitis: Secondary | ICD-10-CM

## 2012-01-03 DIAGNOSIS — M545 Low back pain: Secondary | ICD-10-CM

## 2012-01-03 NOTE — Assessment & Plan Note (Signed)
Continue with current prescription therapy as reflected on the Med list.  

## 2012-01-03 NOTE — Progress Notes (Signed)
Subjective:    Patient ID: Natalie Fletcher, female    DOB: 1951-03-24, 61 y.o.   MRN: [id number]  HPI  Req by Dr Sherlean Foot Reason R TKR surg clearance  Review of Systems  Constitutional: Negative for fever, chills, diaphoresis, activity change, appetite change, fatigue and unexpected weight change.  HENT: Negative for hearing loss, ear pain, congestion, sore throat, sneezing, mouth sores, neck pain, dental problem, voice change, postnasal drip and sinus pressure.   Eyes: Negative for pain and visual disturbance.  Respiratory: Negative for cough, chest tightness, wheezing and stridor.   Cardiovascular: Negative for chest pain, palpitations and leg swelling.  Gastrointestinal: Negative for nausea, vomiting, abdominal pain, blood in stool, abdominal distention and rectal pain.  Genitourinary: Negative for dysuria, hematuria, decreased urine volume, vaginal bleeding, vaginal discharge, difficulty urinating, vaginal pain and menstrual problem.  Musculoskeletal: Negative for back pain, joint swelling and gait problem.  Skin: Negative for color change, rash and wound.  Neurological: Negative for dizziness, tremors, syncope, speech difficulty and light-headedness.  Hematological: Negative for adenopathy.  Psychiatric/Behavioral: Negative for suicidal ideas, hallucinations, behavioral problems, confusion, sleep disturbance, dysphoric mood and decreased concentration. The patient is not hyperactive.    Wt Readings from Last 3 Encounters:  01/03/12 171 lb (77.565 kg)  10/05/10 183 lb (83.008 kg)  04/21/10 176 lb 12 oz (80.173 kg)   BP Readings from Last 3 Encounters:  01/03/12 130/90  10/05/10 136/80  04/21/10 138/88   Past Medical History  Diagnosis Date  . GERD (gastroesophageal reflux disease)    History reviewed. No pertinent past surgical history.  reports that she has quit smoking. She does not have any smokeless tobacco history on file. She reports that she drinks about .6 ounces of  alcohol per week. She reports that she does not use illicit drugs. family history includes COPD in her father and Cancer (age of onset:75) in her mother. No Known Allergies Current Outpatient Prescriptions on File Prior to Visit  Medication Sig Dispense Refill  . acyclovir (ZOVIRAX) 400 MG tablet TAKE 1 TABLET BY MOUTH 3 TIMES A DAY AS NEEDED HERPES FOR 7 DAYS  21 tablet  1  . buPROPion (WELLBUTRIN SR) 150 MG 12 hr tablet Take 1 tablet (150 mg total) by mouth 2 (two) times daily.  180 tablet  2  . pantoprazole (PROTONIX) 40 MG tablet Take 1 tablet (40 mg total) by mouth daily.  90 tablet  1  . pantoprazole (PROTONIX) 40 MG tablet TAKE 1 TABLET BY MOUTH EVERY DAY 30 MINUTES BEFORE A MEAL  90 tablet  1  . pseudoephedrine ([facility] 12 HOUR COLD RELIEF) 120 MG 12 hr tablet Take 1 tablet (120 mg total) by mouth every 12 (twelve) hours.  60 tablet  1        Objective:   Physical Exam  Constitutional: She appears well-developed. No distress.  HENT:  Head: Normocephalic.  Right Ear: External ear normal.  Left Ear: External ear normal.  Nose: Nose normal.  Mouth/Throat: Oropharynx is clear and moist.  Eyes: Conjunctivae are normal. Pupils are equal, round, and reactive to light. Right eye exhibits no discharge. Left eye exhibits no discharge.  Neck: Normal range of motion. Neck supple. No JVD present. No tracheal deviation present. No thyromegaly present.  Cardiovascular: Normal rate, regular rhythm and normal heart sounds.   Pulmonary/Chest: No stridor. No respiratory distress. She has no wheezes.  Abdominal: Soft. Bowel sounds are normal. She exhibits no distension and no mass. There is no tenderness. There  is no rebound and no guarding.  Musculoskeletal: She exhibits tenderness (R knee). She exhibits no edema.  Lymphadenopathy:    She has no cervical adenopathy.  Neurological: She displays normal reflexes. No cranial nerve deficit. She exhibits normal muscle tone. Coordination normal.  Skin: No  rash noted. No erythema.  Psychiatric: She has a normal mood and affect. Her behavior is normal. Judgment and thought content normal.     Lab Results  Component Value Date   WBC 5.4 01/21/2009   HGB 13.3 01/21/2009   HCT 39.1 01/21/2009   PLT 259.0 01/21/2009   GLUCOSE 108* 01/21/2009   CHOL 187 12/03/2007   TRIG 42 12/03/2007   HDL 74.0 12/03/2007   LDLCALC 105* 12/03/2007   ALT 23 12/03/2007   AST 26 12/03/2007   NA 143 01/21/2009   K 4.6 01/21/2009   CL 105 01/21/2009   CREATININE 0.8 01/21/2009   BUN 13 01/21/2009   CO2 31 01/21/2009   TSH 1.81 12/03/2007   EKG OK     Assessment & Plan:

## 2012-01-03 NOTE — Patient Instructions (Signed)
IT band stretch 

## 2012-01-03 NOTE — Assessment & Plan Note (Signed)
Off and on.   

## 2012-01-03 NOTE — Assessment & Plan Note (Signed)
3/13 R TKR EKG was ok She should be clear for surgery assuming her labs are OK  Thank you!

## 2012-01-15 ENCOUNTER — Telehealth: Payer: Self-pay

## 2012-01-15 NOTE — Telephone Encounter (Signed)
[facility] Triage Call Report Triage Record Num: [id number] Operator: [facility] Patient Name: Natalie Fletcher Call Date & Time: 01/12/2012 11:09:47AM Patient Phone: [phone] PCP: Sonda Primes Patient Gender: Female PCP Fax : [phone] Patient DOB: [birth year] Practice Name: [facility] Reason for Call: Caller: Taneisha/Patient; PCP: Sonda Primes; CB#: [phone]; Call regarding Chest Congestion Wants Zpac; sx started 01/11/12; sx include scratchy throat; cough; congestion; no fever; states that sore throat is the worst sx; Triaged per Sore Throat or Hoarseness Guideline; Homecare per all other sx; pt is wanting antibiotic called in; explained that she will need to be seen no Rx will be called; offered clinic tomorrow morning; pt states that she will call back for an appt Protocol(s) Used: Sore Throat or Hoarseness Protocol(s) Used: Upper Respiratory Infection (URI) Recommended Outcome per Protocol: Provide Home/Self Care Reason for Outcome: Sore throat is the symptom causing most concern All other situations Care Advice: ~ Use a cool mist humidifier to moisten air. Be sure to clean according to manufacturer's instructions. ~ SYMPTOM / CONDITION MANAGEMENT Most adults need to drink 6-10 eight-ounce glasses (1.2-2.0 liters) of fluids per day unless previously told to limit fluid intake for other medical reasons. Limit fluids that contain caffeine, sugar or alcohol. Urine will be a very light yellow color when you drink enough fluids. ~ Analgesic/Antipyretic Advice - Acetaminophen: Consider acetaminophen as directed on label or by pharmacist/provider for pain or fever PRECAUTIONS: - Use if there is no history of liver disease, alcoholism, or intake of three or more alcohol drinks per day - Only if approved by provider during pregnancy or when breastfeeding - During pregnancy, acetaminophen should not be taken more than 3 consecutive days without telling provider - Do  not exceed recommended dose or frequency ~ Sore Throat Relief: - Use warm salt water gargles 3 to 4 times/day, as needed (1/2 tsp. salt in 8 oz. [.2 liters] water). - Suck on hard candy, nonprescription or herbal throat lozenges (sugar-free if diabetic) - Eat soothing, soft food/fluids (broths, soups, or honey and lemon juice in hot tea, Popsicles, frozen yogurt or sherbet, scrambled eggs, cooked cereals, Jell-O or puddings) whichever is most comforting. - Avoid eating salty, spicy or acidic foods. ~ Analgesic/Antipyretic Advice - NSAIDs: Consider aspirin, ibuprofen, naproxen or ketoprofen for pain or fever as directed on label or by pharmacist/provider. PRECAUTIONS: - If over 50 years of age, should not take longer than 1 week without consulting provider. EXCEPTIONS: - Should not be used if taking blood thinners or have bleeding problems. - Do not use if have history of sensitivity/allergy to any of these medications; or history of cardiovascular, ulcer, kidney, liver disease or diabetes unless approved by provider. - Do not exceed recommended dose or frequency. ~ 01/12/2012 11:19:23AM Page 1 of 1 [email or web address]

## 2012-01-16 ENCOUNTER — Encounter: Payer: Self-pay | Admitting: Internal Medicine

## 2012-01-16 ENCOUNTER — Ambulatory Visit (INDEPENDENT_AMBULATORY_CARE_PROVIDER_SITE_OTHER): Payer: 59 | Admitting: Internal Medicine

## 2012-01-16 ENCOUNTER — Other Ambulatory Visit: Payer: Self-pay | Admitting: Internal Medicine

## 2012-01-16 VITALS — BP 140/80 | HR 80 | Temp 97.7°F | Resp 16 | Wt 170.0 lb

## 2012-01-16 DIAGNOSIS — B009 Herpesviral infection, unspecified: Secondary | ICD-10-CM

## 2012-01-16 DIAGNOSIS — J069 Acute upper respiratory infection, unspecified: Secondary | ICD-10-CM

## 2012-01-16 DIAGNOSIS — J019 Acute sinusitis, unspecified: Secondary | ICD-10-CM

## 2012-01-16 MED ORDER — ACYCLOVIR 400 MG PO TABS: 400.0000 mg | ORAL_TABLET | Freq: Three times a day (TID) | ORAL | Status: DC

## 2012-01-16 MED ORDER — HYDROCOD POLST-CHLORPHEN POLST 10-8 MG/5ML PO LQCR: 5.0000 mL | Freq: Two times a day (BID) | ORAL | Status: DC | PRN

## 2012-01-16 MED ORDER — PSEUDOEPHEDRINE HCL ER 120 MG PO TB12: 120.0000 mg | ORAL_TABLET | Freq: Two times a day (BID) | ORAL | Status: DC

## 2012-01-16 MED ORDER — CEFUROXIME AXETIL 500 MG PO TABS: ORAL_TABLET | ORAL | Status: AC

## 2012-01-16 MED ORDER — AZITHROMYCIN 250 MG PO TABS: ORAL_TABLET | ORAL | Status: AC

## 2012-01-16 NOTE — Assessment & Plan Note (Signed)
Start Ceftin 

## 2012-01-16 NOTE — Progress Notes (Signed)
Patient ID: Natalie Fletcher, female   DOB: 10-02-1951, 61 y.o.   MRN: [id number]  Subjective:    Patient ID: Natalie Fletcher, female    DOB: 11-22-1950, 61 y.o.   MRN: [id number]  Cough Associated symptoms include a fever and a sore throat. Pertinent negatives include no chest pain, chills, ear pain, postnasal drip, rash or wheezing.  Ear Fullness  Associated symptoms include coughing and a sore throat. Pertinent negatives include no abdominal pain, hearing loss, neck pain, rash or vomiting.    C/o URI x 2 wks  Review of Systems  Constitutional: Positive for fever and fatigue. Negative for chills, diaphoresis, activity change and unexpected weight change.  HENT: Positive for congestion, sore throat and trouble swallowing. Negative for hearing loss, ear pain, sneezing, mouth sores, neck pain, dental problem, voice change, postnasal drip and sinus pressure.   Eyes: Negative for pain and visual disturbance.  Respiratory: Positive for cough. Negative for chest tightness, wheezing and stridor.   Cardiovascular: Negative for chest pain, palpitations and leg swelling.  Gastrointestinal: Negative for nausea, vomiting, abdominal pain, blood in stool, abdominal distention and rectal pain.  Genitourinary: Negative for dysuria, hematuria, decreased urine volume, vaginal bleeding, vaginal discharge, difficulty urinating, vaginal pain and menstrual problem.  Musculoskeletal: Negative for back pain, joint swelling and gait problem.  Skin: Negative for color change, rash and wound.  Neurological: Negative for dizziness, tremors, syncope, speech difficulty and light-headedness.  Hematological: Negative for adenopathy.  Psychiatric/Behavioral: Negative for suicidal ideas, hallucinations, behavioral problems, confusion, sleep disturbance, dysphoric mood and decreased concentration. The patient is not hyperactive.    Wt Readings from Last 3 Encounters:  01/16/12 170 lb (77.111 kg)  01/03/12 171 lb (77.565 kg)    10/05/10 183 lb (83.008 kg)   BP Readings from Last 3 Encounters:  01/16/12 140/80  01/03/12 130/90  10/05/10 136/80   Past Medical History  Diagnosis Date  . GERD (gastroesophageal reflux disease)    History reviewed. No pertinent past surgical history.  reports that she has quit smoking. She does not have any smokeless tobacco history on file. She reports that she drinks about .6 ounces of alcohol per week. She reports that she does not use illicit drugs. family history includes COPD in her father and Cancer (age of onset:75) in her mother. No Known Allergies Current Outpatient Prescriptions on File Prior to Visit  Medication Sig Dispense Refill  . acyclovir (ZOVIRAX) 400 MG tablet TAKE 1 TABLET BY MOUTH 3 TIMES A DAY AS NEEDED HERPES FOR 7 DAYS  21 tablet  1  . buPROPion (WELLBUTRIN SR) 150 MG 12 hr tablet Take 1 tablet (150 mg total) by mouth 2 (two) times daily.  180 tablet  2  . pantoprazole (PROTONIX) 40 MG tablet Take 1 tablet (40 mg total) by mouth daily.  90 tablet  1  . pseudoephedrine ([facility] 12 HOUR COLD RELIEF) 120 MG 12 hr tablet Take 1 tablet (120 mg total) by mouth every 12 (twelve) hours.  60 tablet  1  . DISCONTD: pantoprazole (PROTONIX) 40 MG tablet TAKE 1 TABLET BY MOUTH EVERY DAY 30 MINUTES BEFORE A MEAL  90 tablet  1        Objective:   Physical Exam  Constitutional: She appears well-developed. No distress.  HENT:  Head: Normocephalic.  Right Ear: External ear normal.  Left Ear: External ear normal.  Nose: Nose normal.  Mouth/Throat: No oropharyngeal exudate.       eryth throat Fluid behind R TM  Eyes: Conjunctivae are normal. Pupils are equal, round, and reactive to light. Right eye exhibits no discharge. Left eye exhibits no discharge.  Neck: Normal range of motion. Neck supple. No JVD present. No tracheal deviation present. No thyromegaly present.  Cardiovascular: Normal rate, regular rhythm and normal heart sounds.   Pulmonary/Chest: No stridor. No  respiratory distress. She has no wheezes.  Abdominal: Soft. Bowel sounds are normal. She exhibits no distension and no mass. There is no tenderness. There is no rebound and no guarding.  Musculoskeletal: She exhibits tenderness (R knee). She exhibits no edema.  Lymphadenopathy:    She has no cervical adenopathy.  Neurological: She displays normal reflexes. No cranial nerve deficit. She exhibits normal muscle tone. Coordination normal.  Skin: No rash noted. No erythema.  Psychiatric: She has a normal mood and affect. Her behavior is normal. Judgment and thought content normal.   Cold sore upper lip  Lab Results  Component Value Date   WBC 5.4 01/21/2009   HGB 13.3 01/21/2009   HCT 39.1 01/21/2009   PLT 259.0 01/21/2009   GLUCOSE 108* 01/21/2009   CHOL 187 12/03/2007   TRIG 42 12/03/2007   HDL 74.0 12/03/2007   LDLCALC 105* 12/03/2007   ALT 23 12/03/2007   AST 26 12/03/2007   NA 143 01/21/2009   K 4.6 01/21/2009   CL 105 01/21/2009   CREATININE 0.8 01/21/2009   BUN 13 01/21/2009   CO2 31 01/21/2009   TSH 1.81 12/03/2007   EKG OK     Assessment & Plan:

## 2012-01-16 NOTE — Assessment & Plan Note (Signed)
Acyclovir 

## 2012-01-16 NOTE — Assessment & Plan Note (Signed)
Cough -- Tussionex

## 2012-01-20 ENCOUNTER — Other Ambulatory Visit: Payer: Self-pay | Admitting: Internal Medicine

## 2012-01-22 ENCOUNTER — Telehealth: Payer: Self-pay | Admitting: *Deleted

## 2012-01-22 NOTE — Telephone Encounter (Signed)
Requested Medications     cefUROXime (CEFTIN) 500 MG tablet [Pharmacy Med Name: CEFUROXIME AXETIL 500 MG TAB]   TAKE 1 TABLET BY MOUTH TWICE A DAY   Disp: 20 tablet R: 0 Start: 01/20/2012  Class: Normal   Requested on: 01/16/2012   Originally ordered on: 01/16/2012  Last refill: 01/16/2012

## 2012-03-11 ENCOUNTER — Other Ambulatory Visit: Payer: Self-pay | Admitting: Gastroenterology

## 2012-04-04 ENCOUNTER — Telehealth: Payer: Self-pay | Admitting: *Deleted

## 2012-04-04 NOTE — Telephone Encounter (Signed)
Pt is requesting referral for renal u/s. She has family history of renal cancer and her last u/s was 2011. Please advise.

## 2012-04-08 NOTE — Telephone Encounter (Signed)
Ins will not pay. I can see her on Thursday this wk after 1 pm to discuss Thx

## 2012-04-09 NOTE — Telephone Encounter (Signed)
Pt informed and scheduled for Thursday.

## 2012-04-11 ENCOUNTER — Encounter: Payer: Self-pay | Admitting: Internal Medicine

## 2012-04-11 ENCOUNTER — Ambulatory Visit (INDEPENDENT_AMBULATORY_CARE_PROVIDER_SITE_OTHER): Payer: 59 | Admitting: Internal Medicine

## 2012-04-11 VITALS — BP 132/82 | HR 74 | Temp 98.3°F | Resp 16 | Wt 177.5 lb

## 2012-04-11 DIAGNOSIS — Q619 Cystic kidney disease, unspecified: Secondary | ICD-10-CM

## 2012-04-11 DIAGNOSIS — N281 Cyst of kidney, acquired: Secondary | ICD-10-CM | POA: Insufficient documentation

## 2012-04-11 DIAGNOSIS — M545 Low back pain: Secondary | ICD-10-CM

## 2012-04-11 NOTE — Progress Notes (Signed)
Patient ID: Natalie Fletcher, female   DOB: 1950-12-28, 61 y.o.   MRN: [id number]  Subjective:    Patient ID: Natalie Fletcher, female    DOB: [birth year], 61 y.o.   MRN: [id number]  HPI    Review of Systems  Constitutional: Negative for fever, chills, diaphoresis, activity change, appetite change, fatigue and unexpected weight change.  HENT: Negative for hearing loss, ear pain, congestion, sore throat, sneezing, mouth sores, neck pain, dental problem, voice change, postnasal drip and sinus pressure.   Eyes: Negative for pain and visual disturbance.  Respiratory: Negative for cough, chest tightness, wheezing and stridor.   Cardiovascular: Negative for chest pain, palpitations and leg swelling.  Gastrointestinal: Negative for nausea, vomiting, abdominal pain, blood in stool, abdominal distention and rectal pain.  Genitourinary: Negative for dysuria, hematuria, decreased urine volume, vaginal bleeding, vaginal discharge, difficulty urinating, vaginal pain and menstrual problem.  Musculoskeletal: Negative for back pain, joint swelling and gait problem.  Skin: Negative for color change, rash and wound.  Neurological: Negative for dizziness, tremors, syncope, speech difficulty and light-headedness.  Hematological: Negative for adenopathy.  Psychiatric/Behavioral: Negative for suicidal ideas, hallucinations, behavioral problems, confusion, disturbed wake/sleep cycle, dysphoric mood and decreased concentration. The patient is not hyperactive.    Wt Readings from Last 3 Encounters:  04/11/12 177 lb 8 oz (80.513 kg)  01/16/12 170 lb (77.111 kg)  01/03/12 171 lb (77.565 kg)   BP Readings from Last 3 Encounters:  04/11/12 132/82  01/16/12 140/80  01/03/12 130/90   Past Medical History  Diagnosis Date  . GERD (gastroesophageal reflux disease)    History reviewed. No pertinent past surgical history.  reports that she has quit smoking. She does not have any smokeless tobacco history on file. She  reports that she drinks about .6 ounces of alcohol per week. She reports that she does not use illicit drugs. family history includes COPD in her father; Cancer (age of onset:50) in her maternal grandmother; and Cancer (age of onset:75) in her mother. Allergies  Allergen Reactions  . Codeine Nausea And Vomiting   Current Outpatient Prescriptions on File Prior to Visit  Medication Sig Dispense Refill  . buPROPion (WELLBUTRIN SR) 150 MG 12 hr tablet Take 1 tablet (150 mg total) by mouth 2 (two) times daily.  180 tablet  2  . chlorpheniramine-HYDROcodone (TUSSIONEX PENNKINETIC ER) 10-8 MG/5ML LQCR Take 5 mLs by mouth every 12 (twelve) hours as needed.  115 mL  0  . pantoprazole (PROTONIX) 40 MG tablet TAKE 1 TABLET BY MOUTH EVERY DAY  90 tablet  1  . pseudoephedrine (CVS 12 HOUR COLD RELIEF) 120 MG 12 hr tablet Take 1 tablet (120 mg total) by mouth every 12 (twelve) hours.  60 tablet  1        Objective:   Physical Exam  Constitutional: She appears well-developed. No distress.  HENT:  Head: Normocephalic.  Right Ear: External ear normal.  Left Ear: External ear normal.  Nose: Nose normal.  Mouth/Throat: Oropharynx is clear and moist.  Eyes: Conjunctivae are normal. Pupils are equal, round, and reactive to light. Right eye exhibits no discharge. Left eye exhibits no discharge.  Neck: Normal range of motion. Neck supple. No JVD present. No tracheal deviation present. No thyromegaly present.  Cardiovascular: Normal rate, regular rhythm and normal heart sounds.   Pulmonary/Chest: No stridor. No respiratory distress. She has no wheezes.  Abdominal: Soft. Bowel sounds are normal. She exhibits no distension and no mass. There is no tenderness. There is  no rebound and no guarding.  Musculoskeletal: She exhibits tenderness (R knee). She exhibits no edema.  Lymphadenopathy:    She has no cervical adenopathy.  Neurological: She displays normal reflexes. No cranial nerve deficit. She exhibits  normal muscle tone. Coordination normal.  Skin: No rash noted. No erythema.  Psychiatric: She has a normal mood and affect. Her behavior is normal. Judgment and thought content normal.     Lab Results  Component Value Date   WBC 5.4 01/21/2009   HGB 13.3 01/21/2009   HCT 39.1 01/21/2009   PLT 259.0 01/21/2009   GLUCOSE 108* 01/21/2009   CHOL 187 12/03/2007   TRIG 42 12/03/2007   HDL 74.0 12/03/2007   LDLCALC 105* 12/03/2007   ALT 23 12/03/2007   AST 26 12/03/2007   NA 143 01/21/2009   K 4.6 01/21/2009   CL 105 01/21/2009   CREATININE 0.8 01/21/2009   BUN 13 01/21/2009   CO2 31 01/21/2009   TSH 1.81 12/03/2007   EKG OK     Assessment & Plan:

## 2012-04-11 NOTE — Assessment & Plan Note (Signed)
6/13 L LBP - will get an [place], UA

## 2012-04-12 ENCOUNTER — Other Ambulatory Visit (INDEPENDENT_AMBULATORY_CARE_PROVIDER_SITE_OTHER): Payer: 59

## 2012-04-12 DIAGNOSIS — K219 Gastro-esophageal reflux disease without esophagitis: Secondary | ICD-10-CM

## 2012-04-12 DIAGNOSIS — M545 Low back pain: Secondary | ICD-10-CM

## 2012-04-12 DIAGNOSIS — Z01818 Encounter for other preprocedural examination: Secondary | ICD-10-CM

## 2012-04-12 DIAGNOSIS — K222 Esophageal obstruction: Secondary | ICD-10-CM

## 2012-04-12 LAB — BASIC METABOLIC PANEL
BUN: 17 mg/dL (ref 6–23)
Calcium: 9.3 mg/dL (ref 8.4–10.5)
Creatinine, Ser: 0.9 mg/dL (ref 0.4–1.2)
GFR: 64.78 mL/min (ref >60.00)

## 2012-04-12 LAB — CBC WITH DIFFERENTIAL/PLATELET
Basophils Absolute: 0 10*3/uL (ref 0.0–0.1)
Basophils Relative: 0.7 % (ref 0.0–3.0)
Eosinophils Absolute: 0.1 10*3/uL (ref 0.0–0.7)
Hemoglobin: 13.3 g/dL (ref 12.0–15.0)
Lymphocytes Relative: 27.5 % (ref 12.0–46.0)
MCHC: 33.4 g/dL (ref 30.0–36.0)
MCV: 99.6 fl (ref 78.0–100.0)
Monocytes Absolute: 0.5 10*3/uL (ref 0.1–1.0)
Neutro Abs: 2.7 10*3/uL (ref 1.4–7.7)
RBC: 3.99 Mil/uL (ref 3.87–5.11)
RDW: 13.3 % (ref 11.5–14.6)

## 2012-04-12 LAB — LIPID PANEL
Cholesterol: 206 mg/dL — ABNORMAL HIGH (ref 0–200)
Triglycerides: 54 mg/dL (ref 0.0–149.0)

## 2012-04-12 LAB — HEPATIC FUNCTION PANEL
ALT: 23 U/L (ref 0–35)
AST: 26 U/L (ref 0–37)
Albumin: 4.1 g/dL (ref 3.5–5.2)
Alkaline Phosphatase: 80 U/L (ref 39–117)
Total Protein: 6.9 g/dL (ref 6.0–8.3)

## 2012-04-12 LAB — URINALYSIS
Bilirubin Urine: NEGATIVE
Ketones, ur: NEGATIVE
Specific Gravity, Urine: 1.02 (ref 1.000–1.030)
Urine Glucose: NEGATIVE
Urobilinogen, UA: 0.2 (ref 0.0–1.0)

## 2012-04-12 LAB — TSH: TSH: 0.86 u[IU]/mL (ref 0.35–5.50)

## 2012-04-15 ENCOUNTER — Ambulatory Visit
Admission: RE | Admit: 2012-04-15 | Discharge: 2012-04-15 | Disposition: A | Discharge status: Home / Self Care | Payer: 59 | Source: Ambulatory Visit | Attending: Internal Medicine | Admitting: Internal Medicine

## 2012-04-15 ENCOUNTER — Other Ambulatory Visit: Payer: Self-pay | Admitting: *Deleted

## 2012-04-15 ENCOUNTER — Ambulatory Visit: Admission: RE | Admit: 2012-04-15 | Payer: 59 | Source: Ambulatory Visit

## 2012-04-15 ENCOUNTER — Telehealth: Payer: Self-pay | Admitting: Internal Medicine

## 2012-04-15 DIAGNOSIS — M545 Low back pain: Secondary | ICD-10-CM

## 2012-04-15 DIAGNOSIS — N281 Cyst of kidney, acquired: Secondary | ICD-10-CM

## 2012-04-15 NOTE — Telephone Encounter (Signed)
Pt informed/ copies mailed.  

## 2012-04-15 NOTE — Telephone Encounter (Signed)
Natalie Fletcher, please, inform patient that her abd [place] was OK  Please, mail the labs to the patient.    Thx

## 2012-05-03 ENCOUNTER — Encounter (HOSPITAL_COMMUNITY): Payer: Self-pay

## 2012-05-06 ENCOUNTER — Other Ambulatory Visit: Payer: Self-pay | Admitting: Orthopedic Surgery

## 2012-05-13 ENCOUNTER — Ambulatory Visit (HOSPITAL_COMMUNITY)
Admission: RE | Admit: 2012-05-13 | Discharge: 2012-05-13 | Disposition: A | Discharge status: Home / Self Care | Payer: 59 | Source: Ambulatory Visit | Attending: Orthopedic Surgery | Admitting: Orthopedic Surgery

## 2012-05-13 ENCOUNTER — Encounter (HOSPITAL_COMMUNITY)
Admission: RE | Admit: 2012-05-13 | Discharge: 2012-05-13 | Disposition: A | Discharge status: Home / Self Care | Payer: 59 | Source: Ambulatory Visit | Attending: Orthopedic Surgery | Admitting: Orthopedic Surgery

## 2012-05-13 ENCOUNTER — Encounter (HOSPITAL_COMMUNITY): Payer: Self-pay

## 2012-05-13 DIAGNOSIS — Z01818 Encounter for other preprocedural examination: Secondary | ICD-10-CM | POA: Insufficient documentation

## 2012-05-13 DIAGNOSIS — Z01812 Encounter for preprocedural laboratory examination: Secondary | ICD-10-CM | POA: Insufficient documentation

## 2012-05-13 HISTORY — DX: Unspecified osteoarthritis, unspecified site: M19.90

## 2012-05-13 LAB — APTT: aPTT: 29 seconds (ref 24–37)

## 2012-05-13 LAB — CBC
MCV: 99 fL (ref 78.0–100.0)
Platelets: 250 10*3/uL (ref 150–400)
RBC: 4.08 MIL/uL (ref 3.87–5.11)
WBC: 5 10*3/uL (ref 4.0–10.5)

## 2012-05-13 LAB — COMPREHENSIVE METABOLIC PANEL
ALT: 23 U/L (ref 0–35)
AST: 25 U/L (ref 0–37)
Alkaline Phosphatase: 86 U/L (ref 39–117)
CO2: 27 mEq/L (ref 19–32)
Chloride: 105 mEq/L (ref 96–112)
Creatinine, Ser: 0.77 mg/dL (ref 0.50–1.10)
GFR calc non Af Amer: 88 mL/min — ABNORMAL LOW (ref >90)
Potassium: 4.5 mEq/L (ref 3.5–5.1)
Total Bilirubin: 0.4 mg/dL (ref 0.3–1.2)

## 2012-05-13 LAB — DIFFERENTIAL
Basophils Absolute: 0 10*3/uL (ref 0.0–0.1)
Basophils Relative: 1 % (ref 0–1)
Eosinophils Absolute: 0.1 10*3/uL (ref 0.0–0.7)
Eosinophils Relative: 2 % (ref 0–5)
Monocytes Absolute: 0.3 10*3/uL (ref 0.1–1.0)
Monocytes Relative: 5 % (ref 3–12)

## 2012-05-13 LAB — URINALYSIS, ROUTINE W REFLEX MICROSCOPIC
Glucose, UA: NEGATIVE mg/dL
Hgb urine dipstick: NEGATIVE
Ketones, ur: NEGATIVE mg/dL
Protein, ur: NEGATIVE mg/dL
pH: 6 (ref 5.0–8.0)

## 2012-05-13 LAB — TYPE AND SCREEN
ABO/RH(D): O POS
Antibody Screen: NEGATIVE

## 2012-05-13 LAB — SURGICAL PCR SCREEN: MRSA, PCR: NEGATIVE

## 2012-05-13 NOTE — Pre-Procedure Instructions (Signed)
20 Natalie Fletcher  05/13/2012   Your procedure is scheduled on:  05-20-2012  Report to [facility] at 0645 AM.  Call this number if you have problems the morning of surgery: [phone]   Remember:   Do not eat food or drink:After Midnight.  .  Take these medicines the morning of surgery with A SIP OF WATER: Bupropion(Wellbutrin),Pantopraazole(Protonix)   Do not wear jewelry, make-up or nail polish.  Do not wear lotions, powders, or perfumes. You may wear deodorant.  Do not shave 48 hours prior to surgery. Men may shave face and neck.  Do not bring valuables to the hospital.  Contacts, dentures or bridgework may not be worn into surgery.  Leave suitcase in the car. After surgery it may be brought to your room.  For patients admitted to the hospital, checkout time is 11:00 AM the day of discharge.     Special Instructions: Incentive Spirometry - Practice and bring it with you on the day of surgery. and CHG Shower Use Special Wash: 1/2 bottle night before surgery and 1/2 bottle morning of surgery.   Please read over the following fact sheets that you were given: Pain Booklet, MRSA Information and Surgical Site Infection Prevention

## 2012-05-14 LAB — URINE CULTURE
Colony Count: NO GROWTH
Culture: NO GROWTH

## 2012-05-19 MED ORDER — CEFAZOLIN SODIUM-DEXTROSE 2-3 GM-% IV SOLR
2.0000 g | INTRAVENOUS | Status: AC
  Administered 2012-05-20: 2 g via INTRAVENOUS
  Filled 2012-05-19: qty 50

## 2012-05-20 ENCOUNTER — Inpatient Hospital Stay (HOSPITAL_COMMUNITY)
Admission: RE | Admit: 2012-05-20 | Discharge: 2012-05-21 | DRG: 470 | Disposition: A | Discharge status: Home / Self Care | Payer: 59 | Source: Ambulatory Visit | Attending: Orthopedic Surgery | Admitting: Orthopedic Surgery

## 2012-05-20 ENCOUNTER — Encounter (HOSPITAL_COMMUNITY): Payer: Self-pay | Admitting: Anesthesiology

## 2012-05-20 ENCOUNTER — Encounter (HOSPITAL_COMMUNITY)
Admission: RE | Disposition: A | Discharge status: Home / Self Care | Payer: Self-pay | Source: Ambulatory Visit | Attending: Orthopedic Surgery

## 2012-05-20 ENCOUNTER — Ambulatory Visit (HOSPITAL_COMMUNITY): Payer: 59

## 2012-05-20 ENCOUNTER — Ambulatory Visit (HOSPITAL_COMMUNITY): Payer: 59 | Admitting: Anesthesiology

## 2012-05-20 DIAGNOSIS — F329 Major depressive disorder, single episode, unspecified: Secondary | ICD-10-CM | POA: Diagnosis present

## 2012-05-20 DIAGNOSIS — Z79899 Other long term (current) drug therapy: Secondary | ICD-10-CM

## 2012-05-20 DIAGNOSIS — E669 Obesity, unspecified: Secondary | ICD-10-CM | POA: Diagnosis present

## 2012-05-20 DIAGNOSIS — M1711 Unilateral primary osteoarthritis, right knee: Secondary | ICD-10-CM

## 2012-05-20 DIAGNOSIS — F3289 Other specified depressive episodes: Secondary | ICD-10-CM | POA: Diagnosis present

## 2012-05-20 DIAGNOSIS — Q619 Cystic kidney disease, unspecified: Secondary | ICD-10-CM

## 2012-05-20 DIAGNOSIS — M1710 Unilateral primary osteoarthritis, unspecified knee: Principal | ICD-10-CM | POA: Diagnosis present

## 2012-05-20 DIAGNOSIS — D62 Acute posthemorrhagic anemia: Secondary | ICD-10-CM | POA: Diagnosis present

## 2012-05-20 DIAGNOSIS — K219 Gastro-esophageal reflux disease without esophagitis: Secondary | ICD-10-CM | POA: Diagnosis present

## 2012-05-20 HISTORY — PX: PARTIAL KNEE ARTHROPLASTY: SHX2174

## 2012-05-20 LAB — GLUCOSE, CAPILLARY: Glucose-Capillary: 166 mg/dL — ABNORMAL HIGH (ref 70–99)

## 2012-05-20 SURGERY — ARTHROPLASTY, KNEE, UNICOMPARTMENTAL
Anesthesia: General | Site: Knee | Laterality: Right | Wound class: Clean

## 2012-05-20 MED ORDER — ACETAMINOPHEN 650 MG RE SUPP: 650.0000 mg | Freq: Four times a day (QID) | RECTAL | Status: DC | PRN

## 2012-05-20 MED ORDER — BUPROPION HCL ER (SR) 150 MG PO TB12
150.0000 mg | ORAL_TABLET | Freq: Two times a day (BID) | ORAL | Status: DC
  Administered 2012-05-20 – 2012-05-21 (×2): 150 mg via ORAL
  Filled 2012-05-20 (×5): qty 1

## 2012-05-20 MED ORDER — ZOLPIDEM TARTRATE 5 MG PO TABS
5.0000 mg | ORAL_TABLET | Freq: Every evening | ORAL | Status: DC | PRN
  Administered 2012-05-21: 5 mg via ORAL
  Filled 2012-05-20: qty 1

## 2012-05-20 MED ORDER — ACETAMINOPHEN 10 MG/ML IV SOLN
INTRAVENOUS | Status: AC
  Filled 2012-05-20: qty 100

## 2012-05-20 MED ORDER — ACETAMINOPHEN 10 MG/ML IV SOLN
1000.0000 mg | Freq: Four times a day (QID) | INTRAVENOUS | Status: AC
  Administered 2012-05-20 – 2012-05-21 (×4): 1000 mg via INTRAVENOUS
  Filled 2012-05-20 (×4): qty 100

## 2012-05-20 MED ORDER — METHOCARBAMOL 500 MG PO TABS
500.0000 mg | ORAL_TABLET | Freq: Four times a day (QID) | ORAL | Status: DC | PRN
  Administered 2012-05-20 – 2012-05-21 (×3): 500 mg via ORAL
  Filled 2012-05-20 (×2): qty 1

## 2012-05-20 MED ORDER — MIDAZOLAM HCL 5 MG/5ML IJ SOLN
INTRAMUSCULAR | Status: DC | PRN
  Administered 2012-05-20 (×2): 1 mg via INTRAVENOUS

## 2012-05-20 MED ORDER — OXYCODONE HCL 5 MG PO TABS
5.0000 mg | ORAL_TABLET | ORAL | Status: DC | PRN
  Administered 2012-05-20 – 2012-05-21 (×5): 10 mg via ORAL
  Filled 2012-05-20 (×4): qty 2

## 2012-05-20 MED ORDER — DIPHENHYDRAMINE HCL 12.5 MG/5ML PO ELIX: 12.5000 mg | ORAL_SOLUTION | ORAL | Status: DC | PRN

## 2012-05-20 MED ORDER — CHLORHEXIDINE GLUCONATE 4 % EX LIQD: 60.0000 mL | Freq: Once | CUTANEOUS | Status: DC

## 2012-05-20 MED ORDER — OXYCODONE HCL 5 MG PO TABS
ORAL_TABLET | ORAL | Status: AC
  Administered 2012-05-20: 10 mg via ORAL
  Filled 2012-05-20: qty 2

## 2012-05-20 MED ORDER — LACTATED RINGERS IV SOLN
INTRAVENOUS | Status: DC | PRN
  Administered 2012-05-20 (×2): via INTRAVENOUS

## 2012-05-20 MED ORDER — ACETAMINOPHEN 10 MG/ML IV SOLN
1000.0000 mg | Freq: Four times a day (QID) | INTRAVENOUS | Status: DC
  Administered 2012-05-20: 1000 mg via INTRAVENOUS
  Filled 2012-05-20 (×3): qty 100

## 2012-05-20 MED ORDER — DOCUSATE SODIUM 100 MG PO CAPS
100.0000 mg | ORAL_CAPSULE | Freq: Two times a day (BID) | ORAL | Status: DC
  Administered 2012-05-20 – 2012-05-21 (×2): 100 mg via ORAL
  Filled 2012-05-20 (×5): qty 1

## 2012-05-20 MED ORDER — BUPIVACAINE-EPINEPHRINE PF 0.25-1:200000 % IJ SOLN
INTRAMUSCULAR | Status: AC
  Filled 2012-05-20: qty 30

## 2012-05-20 MED ORDER — ONDANSETRON HCL 4 MG PO TABS
4.0000 mg | ORAL_TABLET | Freq: Four times a day (QID) | ORAL | Status: DC | PRN
  Administered 2012-05-20: 4 mg via ORAL
  Filled 2012-05-20: qty 1

## 2012-05-20 MED ORDER — MIDAZOLAM HCL 2 MG/2ML IJ SOLN: 0.5000 mg | Freq: Once | INTRAMUSCULAR | Status: DC | PRN

## 2012-05-20 MED ORDER — CELECOXIB 200 MG PO CAPS
ORAL_CAPSULE | ORAL | Status: AC
  Filled 2012-05-20: qty 1

## 2012-05-20 MED ORDER — SODIUM CHLORIDE 0.9 % IV SOLN: INTRAVENOUS | Status: DC

## 2012-05-20 MED ORDER — METHOCARBAMOL 500 MG PO TABS
ORAL_TABLET | ORAL | Status: AC
  Filled 2012-05-20: qty 1

## 2012-05-20 MED ORDER — SENNOSIDES-DOCUSATE SODIUM 8.6-50 MG PO TABS: 1.0000 | ORAL_TABLET | Freq: Every evening | ORAL | Status: DC | PRN

## 2012-05-20 MED ORDER — BUPIVACAINE-EPINEPHRINE 0.25% -1:200000 IJ SOLN
INTRAMUSCULAR | Status: DC | PRN
  Administered 2012-05-20: 10 mL

## 2012-05-20 MED ORDER — PANTOPRAZOLE SODIUM 40 MG PO TBEC
40.0000 mg | DELAYED_RELEASE_TABLET | Freq: Every day | ORAL | Status: DC
  Administered 2012-05-21: 40 mg via ORAL
  Filled 2012-05-20: qty 1

## 2012-05-20 MED ORDER — HYDROMORPHONE HCL PF 1 MG/ML IJ SOLN: 1.0000 mg | INTRAMUSCULAR | Status: DC | PRN

## 2012-05-20 MED ORDER — HYDROMORPHONE HCL PF 1 MG/ML IJ SOLN
INTRAMUSCULAR | Status: AC
  Filled 2012-05-20: qty 1

## 2012-05-20 MED ORDER — SODIUM CHLORIDE 0.9 % IR SOLN
Status: DC | PRN
  Administered 2012-05-20: 3000 mL

## 2012-05-20 MED ORDER — FENTANYL CITRATE 0.05 MG/ML IJ SOLN
INTRAMUSCULAR | Status: DC | PRN
  Administered 2012-05-20 (×4): 50 ug via INTRAVENOUS
  Administered 2012-05-20: 100 ug via INTRAVENOUS

## 2012-05-20 MED ORDER — DEXTROSE 5 % IV SOLN
INTRAVENOUS | Status: DC | PRN
  Administered 2012-05-20: 09:00:00 via INTRAVENOUS

## 2012-05-20 MED ORDER — SODIUM CHLORIDE 0.9 % IV SOLN
INTRAVENOUS | Status: DC
  Administered 2012-05-21: 10:00:00 via INTRAVENOUS

## 2012-05-20 MED ORDER — METHOCARBAMOL 100 MG/ML IJ SOLN
500.0000 mg | Freq: Four times a day (QID) | INTRAMUSCULAR | Status: DC | PRN
  Filled 2012-05-20: qty 5

## 2012-05-20 MED ORDER — FLEET ENEMA 7-19 GM/118ML RE ENEM: 1.0000 | ENEMA | Freq: Once | RECTAL | Status: AC | PRN

## 2012-05-20 MED ORDER — METOCLOPRAMIDE HCL 10 MG PO TABS: 5.0000 mg | ORAL_TABLET | Freq: Three times a day (TID) | ORAL | Status: DC | PRN

## 2012-05-20 MED ORDER — BISACODYL 5 MG PO TBEC: 5.0000 mg | DELAYED_RELEASE_TABLET | Freq: Every day | ORAL | Status: DC | PRN

## 2012-05-20 MED ORDER — ONDANSETRON HCL 4 MG/2ML IJ SOLN
INTRAMUSCULAR | Status: DC | PRN
  Administered 2012-05-20: 4 mg via INTRAVENOUS

## 2012-05-20 MED ORDER — LIDOCAINE HCL (CARDIAC) 20 MG/ML IV SOLN
INTRAVENOUS | Status: DC | PRN
  Administered 2012-05-20: 80 mg via INTRAVENOUS

## 2012-05-20 MED ORDER — ACETAMINOPHEN 325 MG PO TABS: 650.0000 mg | ORAL_TABLET | Freq: Four times a day (QID) | ORAL | Status: DC | PRN

## 2012-05-20 MED ORDER — ENOXAPARIN SODIUM 30 MG/0.3ML ~~LOC~~ SOLN
30.0000 mg | Freq: Two times a day (BID) | SUBCUTANEOUS | Status: DC
  Administered 2012-05-21 (×2): 30 mg via SUBCUTANEOUS
  Filled 2012-05-20 (×5): qty 0.3

## 2012-05-20 MED ORDER — 0.9 % SODIUM CHLORIDE (POUR BTL) OPTIME
TOPICAL | Status: DC | PRN
  Administered 2012-05-20: 1000 mL

## 2012-05-20 MED ORDER — CEFAZOLIN SODIUM 1-5 GM-% IV SOLN
1.0000 g | Freq: Four times a day (QID) | INTRAVENOUS | Status: AC
  Administered 2012-05-20 – 2012-05-21 (×2): 1 g via INTRAVENOUS
  Filled 2012-05-20 (×2): qty 50

## 2012-05-20 MED ORDER — ALUM & MAG HYDROXIDE-SIMETH 200-200-20 MG/5ML PO SUSP: 30.0000 mL | ORAL | Status: DC | PRN

## 2012-05-20 MED ORDER — MEPERIDINE HCL 25 MG/ML IJ SOLN: 6.2500 mg | INTRAMUSCULAR | Status: DC | PRN

## 2012-05-20 MED ORDER — PROPOFOL 10 MG/ML IV EMUL
INTRAVENOUS | Status: DC | PRN
  Administered 2012-05-20: 140 mg via INTRAVENOUS
  Administered 2012-05-20: 20 mg via INTRAVENOUS

## 2012-05-20 MED ORDER — METOCLOPRAMIDE HCL 5 MG/ML IJ SOLN: 5.0000 mg | Freq: Three times a day (TID) | INTRAMUSCULAR | Status: DC | PRN

## 2012-05-20 MED ORDER — PHENOL 1.4 % MT LIQD: 1.0000 | OROMUCOSAL | Status: DC | PRN

## 2012-05-20 MED ORDER — HYDROMORPHONE HCL PF 1 MG/ML IJ SOLN
0.2500 mg | INTRAMUSCULAR | Status: DC | PRN
  Administered 2012-05-20 (×3): 0.5 mg via INTRAVENOUS

## 2012-05-20 MED ORDER — ONDANSETRON HCL 4 MG/2ML IJ SOLN
4.0000 mg | Freq: Four times a day (QID) | INTRAMUSCULAR | Status: DC | PRN
  Administered 2012-05-21: 4 mg via INTRAVENOUS
  Filled 2012-05-20: qty 2

## 2012-05-20 MED ORDER — BUPIVACAINE-EPINEPHRINE PF 0.5-1:200000 % IJ SOLN
INTRAMUSCULAR | Status: DC | PRN
  Administered 2012-05-20: 30 mL

## 2012-05-20 MED ORDER — PROMETHAZINE HCL 25 MG/ML IJ SOLN: 6.2500 mg | INTRAMUSCULAR | Status: DC | PRN

## 2012-05-20 MED ORDER — CELECOXIB 200 MG PO CAPS
200.0000 mg | ORAL_CAPSULE | Freq: Two times a day (BID) | ORAL | Status: DC
  Administered 2012-05-20 – 2012-05-21 (×3): 200 mg via ORAL
  Filled 2012-05-20 (×6): qty 1

## 2012-05-20 MED ORDER — MENTHOL 3 MG MT LOZG: 1.0000 | LOZENGE | OROMUCOSAL | Status: DC | PRN

## 2012-05-20 SURGICAL SUPPLY — 57 items
BANDAGE ESMARK 6X9 LF (GAUZE/BANDAGES/DRESSINGS) ×1 IMPLANT
BLADE SAW RECIP 87.9 MT (BLADE) ×2 IMPLANT
BLADE SAW SGTL 13X75X1.27 (BLADE) ×2 IMPLANT
BLADE SAW SGTL 83.5X18.5 (BLADE) ×2 IMPLANT
BNDG CMPR MED 10X6 ELC LF (GAUZE/BANDAGES/DRESSINGS) ×1
BNDG ELASTIC 6X10 VLCR STRL LF (GAUZE/BANDAGES/DRESSINGS) ×2 IMPLANT
BNDG ESMARK 6X9 LF (GAUZE/BANDAGES/DRESSINGS) ×2
BOWL SMART MIX CTS (DISPOSABLE) ×2 IMPLANT
CEMENT BONE SIMPLEX SPEEDSET (Cement) ×2 IMPLANT
CLOTH BEACON ORANGE TIMEOUT ST (SAFETY) ×2 IMPLANT
COVER BACK TABLE 24X17X13 BIG (DRAPES) IMPLANT
COVER SURGICAL LIGHT HANDLE (MISCELLANEOUS) ×4 IMPLANT
CUFF TOURNIQUET SINGLE 34IN LL (TOURNIQUET CUFF) ×2 IMPLANT
DRAPE C-ARM 42X72 X-RAY (DRAPES) ×2 IMPLANT
DRAPE EXTREMITY T 121X128X90 (DRAPE) ×2 IMPLANT
DRAPE INCISE IOBAN 66X45 STRL (DRAPES) ×6 IMPLANT
DRAPE PROXIMA HALF (DRAPES) ×2 IMPLANT
DRAPE U-SHAPE 47X51 STRL (DRAPES) ×2 IMPLANT
DRSG ADAPTIC 3X8 NADH LF (GAUZE/BANDAGES/DRESSINGS) ×2 IMPLANT
DRSG PAD ABDOMINAL 8X10 ST (GAUZE/BANDAGES/DRESSINGS) ×2 IMPLANT
DURAPREP 26ML APPLICATOR (WOUND CARE) ×4 IMPLANT
ELECT REM PT RETURN 9FT ADLT (ELECTROSURGICAL) ×2
ELECTRODE REM PT RTRN 9FT ADLT (ELECTROSURGICAL) ×1 IMPLANT
EVACUATOR 1/8 PVC DRAIN (DRAIN) ×2 IMPLANT
FLUID NSS /IRRIG 3000 ML XXX (IV SOLUTION) ×2 IMPLANT
GLOVE BIOGEL M 7.0 STRL (GLOVE) ×2 IMPLANT
GLOVE BIOGEL PI IND STRL 7.5 (GLOVE) ×1 IMPLANT
GLOVE BIOGEL PI IND STRL 8.5 (GLOVE) ×2 IMPLANT
GLOVE BIOGEL PI INDICATOR 7.5 (GLOVE) ×1
GLOVE BIOGEL PI INDICATOR 8.5 (GLOVE) ×2
GLOVE SURG ORTHO 8.0 STRL STRW (GLOVE) ×4 IMPLANT
GOWN PREVENTION PLUS XLARGE (GOWN DISPOSABLE) ×4 IMPLANT
GOWN STRL NON-REIN LRG LVL3 (GOWN DISPOSABLE) ×4 IMPLANT
HANDPIECE INTERPULSE COAX TIP (DISPOSABLE) ×2
HOOD PEEL AWAY FACE SHEILD DIS (HOOD) ×6 IMPLANT
KIT BASIN OR (CUSTOM PROCEDURE TRAY) ×2 IMPLANT
KIT ROOM TURNOVER OR (KITS) ×2 IMPLANT
MANIFOLD NEPTUNE II (INSTRUMENTS) ×2 IMPLANT
NEEDLE 22X1 1/2 (OR ONLY) (NEEDLE) ×2 IMPLANT
NS IRRIG 1000ML POUR BTL (IV SOLUTION) ×2 IMPLANT
PACK TOTAL JOINT (CUSTOM PROCEDURE TRAY) ×2 IMPLANT
PAD ARMBOARD 7.5X6 YLW CONV (MISCELLANEOUS) ×4 IMPLANT
PADDING CAST COTTON 6X4 STRL (CAST SUPPLIES) ×2 IMPLANT
SET HNDPC FAN SPRY TIP SCT (DISPOSABLE) ×1 IMPLANT
SPONGE GAUZE 4X4 12PLY (GAUZE/BANDAGES/DRESSINGS) ×2 IMPLANT
STAPLER VISISTAT 35W (STAPLE) ×2 IMPLANT
SUCTION FRAZIER TIP 10 FR DISP (SUCTIONS) ×2 IMPLANT
SUT VIC AB 0 CT1 27 (SUTURE) ×4
SUT VIC AB 0 CT1 27XBRD ANBCTR (SUTURE) ×2 IMPLANT
SUT VIC AB 1 CT1 27 (SUTURE) ×2
SUT VIC AB 1 CT1 27XBRD ANBCTR (SUTURE) ×1 IMPLANT
SUT VIC AB 2-0 CT1 27 (SUTURE) ×2
SUT VIC AB 2-0 CT1 TAPERPNT 27 (SUTURE) ×1 IMPLANT
SYR CONTROL 10ML LL (SYRINGE) ×2 IMPLANT
TOWEL OR 17X24 6PK STRL BLUE (TOWEL DISPOSABLE) ×2 IMPLANT
TOWEL OR 17X26 10 PK STRL BLUE (TOWEL DISPOSABLE) ×2 IMPLANT
WATER STERILE IRR 1000ML POUR (IV SOLUTION) ×2 IMPLANT

## 2012-05-20 NOTE — Transfer of Care (Signed)
Immediate Anesthesia Transfer of Care Note  Patient: Natalie Fletcher  Procedure(s) Performed: Procedure(s) (LRB): UNICOMPARTMENTAL KNEE (Right)  Patient Location: PACU  Anesthesia Type: General and GA combined with regional for post-op pain  Level of Consciousness: awake, alert  and oriented  Airway & Oxygen Therapy: Patient Spontanous Breathing and Patient connected to nasal cannula oxygen  Post-op Assessment: Report given to PACU RN, Post -op Vital signs reviewed and stable and Patient moving all extremities  Post vital signs: Reviewed and stable  Complications: No apparent anesthesia complications

## 2012-05-20 NOTE — Progress Notes (Signed)
Orthopedic Tech Progress Note Patient Details:  Natalie Fletcher Aug 10, 1951 [id number]  CPM Right Knee CPM Right Knee: On Right Knee Flexion (Degrees): 90  Right Knee Extension (Degrees): 0  Additional Comments: trapeze bar   Cammer, Mickie Bail 05/20/2012, 12:03 PM

## 2012-05-20 NOTE — Anesthesia Preprocedure Evaluation (Addendum)
Anesthesia Evaluation  Patient identified by MRN, date of birth, ID band  Reviewed: Allergy & Precautions, H&P , NPO status , Patient's Chart, lab work & pertinent test results  History of Anesthesia Complications Negative for: history of anesthetic complications  Airway Mallampati: II TM Distance: >3 FB Neck ROM: Full    Dental  (+) Caps and Dental Advisory Given   Pulmonary neg pulmonary ROS,  breath sounds clear to auscultation  Pulmonary exam normal       Cardiovascular negative cardio ROS  Rhythm:Regular Rate:Normal     Neuro/Psych Depression negative neurological ROS     GI/Hepatic Neg liver ROS, GERD-  Medicated and Controlled,  Endo/Other  negative endocrine ROS  Renal/GU negative Renal ROS     Musculoskeletal negative musculoskeletal ROS (+)   Abdominal (+) + obese,   Peds  Hematology negative hematology ROS (+)   Anesthesia Other Findings   Reproductive/Obstetrics                          Anesthesia Physical Anesthesia Plan  ASA: II  Anesthesia Plan: General   Post-op Pain Management:    Induction: Intravenous  Airway Management Planned: Oral ETT  Additional Equipment:   Intra-op Plan:   Post-operative Plan: Extubation in OR  Informed Consent: I have reviewed the patients History and Physical, chart, labs and discussed the procedure including the risks, benefits and alternatives for the proposed anesthesia with the patient or authorized representative who has indicated his/her understanding and acceptance.   Dental advisory given  Plan Discussed with: Anesthesiologist, Surgeon and CRNA  Anesthesia Plan Comments: (Plan routine monitors, GA- LMA OK, femoral nerve block for post op analgesia)       Anesthesia Quick Evaluation

## 2012-05-20 NOTE — Preoperative (Signed)
Beta Blockers   Reason not to administer Beta Blockers:Not Applicable 

## 2012-05-20 NOTE — Anesthesia Procedure Notes (Addendum)
Anesthesia Regional Block:  Femoral nerve block  Pre-Anesthetic Checklist: ,, timeout performed, Correct Patient, Correct Site, Correct Laterality, Correct Procedure, Correct Position, site marked, Risks and benefits discussed,  Surgical consent,  Pre-op evaluation,  At surgeon's request and post-op pain management  Laterality: Right  Prep: chloraprep       Needles:  Injection technique: Single-shot  Needle Type: Stimulator Needle - 40     Needle Length: 4cm  Needle Gauge: 22 and 22 G    Additional Needles:  Procedures: nerve stimulator Femoral nerve block  Nerve Stimulator or Paresthesia:  Response: patella twitch, 0.45 mA, 0.1 ms,   Additional Responses:   Narrative:  Start time: 05/20/2012 8:25 AM End time: 05/20/2012 8:29 AM Injection made incrementally with aspirations every 5 mL.  Performed by: Personally  Anesthesiologist: Sandford Craze, MD  Additional Notes: Pt identified in Holding room.  Monitors applied. Working IV access confirmed. Sterile prep R knee.  #22ga PNS to patella twitch at 0.21mA threshold.  30cc 0.5% Bupivacaine with 1:200k epi injected incrementally after negative test dose.  Patient asymptomatic, VSS, no heme aspirated, tolerated well.   Sandford Craze, MD   Procedure Name: LMA Insertion Date/Time: 05/20/2012 9:02 AM Performed by: Julianne Rice K Pre-anesthesia Checklist: Patient identified, Timeout performed, Emergency Drugs available, Suction available and Patient being monitored Patient Re-evaluated:Patient Re-evaluated prior to inductionOxygen Delivery Method: Circle system utilized Preoxygenation: Pre-oxygenation with 100% oxygen Intubation Type: IV induction Ventilation: Mask ventilation without difficulty LMA: LMA inserted LMA Size: 4.0 Number of attempts: 1 Tube secured with: Tape Dental Injury: Teeth and Oropharynx as per pre-operative assessment

## 2012-05-20 NOTE — Anesthesia Postprocedure Evaluation (Signed)
  Anesthesia Post-op Note  Patient: Natalie Fletcher  Procedure(s) Performed: Procedure(s) (LRB): UNICOMPARTMENTAL KNEE (Right)  Patient Location: PACU  Anesthesia Type: General and GA combined with regional for post-op pain  Level of Consciousness: awake  Airway and Oxygen Therapy: Patient Spontanous Breathing  Post-op Pain: mild  Post-op Assessment: Post-op Vital signs reviewed, Patient's Cardiovascular Status Stable, Respiratory Function Stable, Patent Airway and No signs of Nausea or vomiting  Post-op Vital Signs: stable  Complications: No apparent anesthesia complications

## 2012-05-20 NOTE — Progress Notes (Signed)
Dr.Jackson notified of wedding band unable to come off

## 2012-05-20 NOTE — Op Note (Signed)
Dictation number: [id number]

## 2012-05-20 NOTE — H&P (Signed)
Natalie Fletcher MRN:  [id number] DOB/SEX:  [birth year]/female  CHIEF COMPLAINT:  Painful right Knee  HISTORY: Patient is a 62 y.o. female presented with a history of pain in the right knee. Onset of symptoms was gradual starting several years ago with gradually worsening course since that time. The patient noted no past surgery on the right knee. Prior procedures on the knee include . Patient has been treated conservatively with over-the-counter NSAIDs and activity modification. Patient currently rates pain in the knee at 7 out of 10 with activity. There is no pain at night.  PAST MEDICAL HISTORY: Patient Active Problem List   Diagnosis Date Noted  . Cyst of left kidney 04/11/2012  . Preoperative evaluation to rule out surgical contraindication 01/03/2012  . GERD 10/07/2010  . NAUSEA 10/07/2010  . UPPER RESPIRATORY INFECTION, ACUTE 10/05/2010  . KNEE SPRAIN, RIGHT 04/21/2010  . DEPRESSION 02/01/2010  . LOW BACK PAIN 02/01/2010  . ALLERGIC RHINITIS 04/08/2009  . CONTACT DERMATITIS&OTHER ECZEMA DUE UNSPEC CAUSE 01/25/2009  . Stricture and stenosis of esophagus 01/21/2009  . NONSPECIFIC ABNORMAL FINDING IN STOOL CONTENTS 01/21/2009  . FEVER BLISTER 10/12/2008  . SINUSITIS- ACUTE-NOS 10/12/2008  . INSOMNIA, PERSISTENT 04/10/2008  . FATIGUE 04/10/2008  . DIVERTICULOSIS, COLON 12/23/2007  . WEIGHT GAIN 12/02/2007   Past Medical History  Diagnosis Date  . GERD (gastroesophageal reflux disease)   . Arthritis    Past Surgical History  Procedure Date  . No past surgeries      MEDICATIONS:   Prescriptions prior to admission  Medication Sig Dispense Refill  . buPROPion (WELLBUTRIN SR) 150 MG 12 hr tablet Take 1 tablet (150 mg total) by mouth 2 (two) times daily.  180 tablet  2  . CALCIUM PO Take 1 tablet by mouth 2 (two) times daily.      . Cholecalciferol (VITAMIN D-3 PO) Take 1 capsule by mouth daily.      . pantoprazole (PROTONIX) 40 MG tablet TAKE 1 TABLET BY MOUTH EVERY DAY   90 tablet  1  . acyclovir (ZOVIRAX) 400 MG tablet Take 400 mg by mouth 3 (three) times daily as needed. For cold sores        ALLERGIES:   Allergies  Allergen Reactions  . Codeine Nausea And Vomiting    REVIEW OF SYSTEMS:  Pertinent items are noted in HPI.   FAMILY HISTORY:   Family History  Problem Relation Age of Onset  . Cancer Mother 63    kidney ca  . COPD Father   . Cancer Maternal Grandmother 50    kidney ca    SOCIAL HISTORY:   History  Substance Use Topics  . Smoking status: Never Smoker   . Smokeless tobacco: Not on file  . Alcohol Use: 0.6 oz/week    1 Glasses of wine per week     occasionally     EXAMINATION:  Vital signs in last 24 hours: Temp:  [98.4 F (36.9 C)] 98.4 F (36.9 C) (08/05 0656) Pulse Rate:  [76] 76  (08/05 0656) Resp:  [20] 20  (08/05 0656) BP: (147)/(88) 147/88 mmHg (08/05 0656) SpO2:  [96 %] 96 % (08/05 0656)  General appearance: alert, cooperative and no distress Lungs: clear to auscultation bilaterally Heart: regular rate and rhythm, S1, S2 normal, no murmur, click, rub or gallop Abdomen: soft, non-tender; bowel sounds normal; no masses,  no organomegaly Extremities: extremities normal, atraumatic, no cyanosis or edema and Homans sign is negative, no sign of DVT Pulses: 2+ and symmetric  Skin: Skin color, texture, turgor normal. No rashes or lesions Neurologic: Alert and oriented X 3, normal strength and tone. Normal symmetric reflexes. Normal coordination and gait  Musculoskeletal:  ROM 0-115, Ligaments intact,  Imaging Review Plain radiographs demonstrate severe degenerative joint disease of the right knee. The overall alignment is mild varus. The bone quality appears to be good for age and reported activity level.  Assessment/Plan: End stage arthritis, right medial knee   The patient history, physical examination and imaging studies are consistent with advanced degenerative joint disease of the right knee. The patient has  failed conservative treatment.  The clearance notes were reviewed.  After discussion with the patient it was felt that Uni Knee Replacement was indicated. The procedure,  risks, and benefits of total knee arthroplasty were presented and reviewed. The risks including but not limited to aseptic loosening, infection, blood clots, vascular injury, stiffness, patella tracking problems complications among others were discussed. The patient acknowledged the explanation, agreed to proceed with the plan.  Natalie Fletcher 05/20/2012, 7:11 AM

## 2012-05-21 ENCOUNTER — Encounter (HOSPITAL_COMMUNITY): Payer: Self-pay | Admitting: Orthopedic Surgery

## 2012-05-21 LAB — BASIC METABOLIC PANEL
BUN: 9 mg/dL (ref 6–23)
CO2: 29 mEq/L (ref 19–32)
Chloride: 102 mEq/L (ref 96–112)
Creatinine, Ser: 0.76 mg/dL (ref 0.50–1.10)
GFR calc Af Amer: >90 mL/min (ref >90)
Potassium: 3.5 mEq/L (ref 3.5–5.1)

## 2012-05-21 LAB — CBC
HCT: 32.7 % — ABNORMAL LOW (ref 36.0–46.0)
Hemoglobin: 10.6 g/dL — ABNORMAL LOW (ref 12.0–15.0)
MCV: 99.4 fL (ref 78.0–100.0)
RBC: 3.29 MIL/uL — ABNORMAL LOW (ref 3.87–5.11)
RDW: 12.7 % (ref 11.5–15.5)
WBC: 6.8 10*3/uL (ref 4.0–10.5)

## 2012-05-21 MED ORDER — ONDANSETRON HCL 4 MG PO TABS: 4.0000 mg | ORAL_TABLET | Freq: Four times a day (QID) | ORAL | Status: AC | PRN

## 2012-05-21 MED ORDER — ENOXAPARIN SODIUM 40 MG/0.4ML ~~LOC~~ SOLN: 40.0000 mg | Freq: Every day | SUBCUTANEOUS | Status: DC

## 2012-05-21 MED ORDER — OXYCODONE HCL 5 MG PO TABS: 5.0000 mg | ORAL_TABLET | ORAL | Status: AC | PRN

## 2012-05-21 MED ORDER — CELECOXIB 200 MG PO CAPS: 200.0000 mg | ORAL_CAPSULE | Freq: Two times a day (BID) | ORAL | Status: AC

## 2012-05-21 MED ORDER — METHOCARBAMOL 500 MG PO TABS: 500.0000 mg | ORAL_TABLET | Freq: Four times a day (QID) | ORAL | Status: AC | PRN

## 2012-05-21 NOTE — Progress Notes (Signed)
Natalie Spurling, MD   Altamese Cabal, PA-C [address] [place], [place], [place]  [address]                             [phone]   PROGRESS NOTE  Subjective:  negative for Chest Pain  negative for Shortness of Breath  negative for Nausea/Vomiting   negative for Calf Pain  negative for Bowel Movement   Tolerating Diet: yes         Patient reports pain as 3 on 0-10 scale.    Objective: Vital signs in last 24 hours:   Patient Vitals for the past 24 hrs:  BP Temp Temp src Pulse Resp SpO2  05/21/12 0648 135/69 mmHg 97.2 F (36.2 C) Oral 90  18  100 %  05/21/12 0150 119/63 mmHg 97.3 F (36.3 C) Oral 75  15  100 %  05/20/12 2200 135/69 mmHg 97.2 F (36.2 C) Oral 90  14  100 %  05/20/12 1900 - - - - - 100 %  05/20/12 1500 132/77 mmHg 96.1 F (35.6 C) - 75  14  92 %  05/20/12 1215 - 97.5 F (36.4 C) - - - -  05/20/12 1134 - - - 71  13  94 %  05/20/12 1133 - - - 73  9  94 %  05/20/12 1132 - - - 70  15  100 %  05/20/12 1131 - - - 71  17  100 %  05/20/12 1130 - - - 77  20  100 %  05/20/12 1129 - - - 76  17  100 %  05/20/12 1128 117/89 mmHg - - 75  19  100 %  05/20/12 1127 - - - 74  20  100 %  05/20/12 1126 - - - 76  18  100 %  05/20/12 1125 - - - 76  22  100 %  05/20/12 1124 - - - 76  20  100 %  05/20/12 1123 - - - 72  11  100 %  05/20/12 1122 - - - 72  15  100 %  05/20/12 1121 - - - 74  14  100 %  05/20/12 1120 - - - 74  17  100 %  05/20/12 1119 - - - 70  9  100 %  05/20/12 1118 - - - 73  12  100 %  05/20/12 1117 - - - 71  8  100 %  05/20/12 1116 - - - 72  13  100 %  05/20/12 1115 - - - 72  14  100 %  05/20/12 1114 - - - 73  12  100 %  05/20/12 1113 - - - 73  5  100 %  05/20/12 1112 - - - 74  14  95 %  05/20/12 1111 147/88 mmHg - - 75  15  88 %  05/20/12 1110 - - - 73  14  94 %  05/20/12 1109 - - - 77  12  99 %  05/20/12 1108 - - - 76  17  100 %  05/20/12 1107 - - - 75  15  100 %  05/20/12 1106 - - - 75  24  99 %  05/20/12 1105 - - - 74  43  100 %  05/20/12 1104  - - - 77  27  100 %  05/20/12 1103 - - - 77  29  100 %  05/20/12 1102 - - - 77  31  100 %  05/20/12 1101 - - - 75  34  100 %  05/20/12 1100 - - - 77  39  100 %  05/20/12 1059 - - - 76  43  99 %  05/20/12 1058 - - - 77  39  99 %  05/20/12 1057 - - - 78  21  100 %  05/20/12 1056 167/94 mmHg - - - - -  05/20/12 1053 - 98 F (36.7 C) - 77  - -    @flow {1959:LAST@   Intake/Output from previous day:   08/05 0701 - 08/06 0700 In: 1450 [P.O.:100; I.V.:1150] Out: 50    Intake/Output this shift:       Intake/Output      08/05 0701 - 08/06 0700 08/06 0701 - 08/07 0700   P.O. 100    I.V. 1150    IV Piggyback 200    Total Intake 1450    Blood 50    Total Output 50    Net +1400         Urine Occurrence        LABORATORY DATA:  Basename 05/21/12 0605  WBC 6.8  HGB 10.6*  HCT 32.7*  PLT 173   No results found for this basename: NA:7,K:7,CL:7,CO2:7,BUN:7,CREATININE:7,GLUCOSE:7,CALCIUM:7 in the last 168 hours Lab Results  Component Value Date   INR 0.91 05/13/2012    Examination:  General appearance: alert, cooperative and no distress Extremities: Homans sign is negative, no sign of DVT  Wound Exam: clean, dry, intact   Drainage:  None: wound tissue dry  Motor Exam: EHL and FHL Intact  Sensory Exam: Deep Peroneal normal  Vascular Exam:    Assessment:    1 Day Post-Op  Procedure(s) (LRB): UNICOMPARTMENTAL KNEE (Right)  ADDITIONAL DIAGNOSIS:  Active Problems:  * No active hospital problems. *   Acute Blood Loss Anemia   Plan: Physical Therapy as ordered Weight Bearing as Tolerated (WBAT)  DVT Prophylaxis:  Lovenox  DISCHARGE PLAN: Home  DISCHARGE NEEDS: HHPT, CPM, Walker and 3-in-1 comode seat         Kellen Dutch 05/21/2012, 7:44 AM

## 2012-05-21 NOTE — Op Note (Signed)
Natalie Fletcher, Natalie Fletcher              ACCOUNT NO.:  [id number]  MEDICAL RECORD NO.:  [id number]  LOCATION:  5N22C                        FACILITY:  [facility]  PHYSICIAN:  Mila Homer. Sherlean Foot, M.D. DATE OF BIRTH:  Jul 18, 1951  DATE OF PROCEDURE:  05/20/2012 DATE OF DISCHARGE:                              OPERATIVE REPORT   SURGEON:  Mila Homer. Sherlean Foot, M.D.  ASSISTANT:  Skip Mayer, [facility].  ANESTHESIA:  General.  PREOPERATIVE DIAGNOSIS:  Right knee medial compartment arthritis.  POSTOPERATIVE DIAGNOSIS:  Right knee medial compartment arthritis.  PROCEDURE:  Right medial unicompartmental arthroplasty.  INDICATION FOR PROCEDURE:  The patient is a 61 year old white female with lower extremity measured for medial compartment osteoarthritis. Informed consent obtained.  DESCRIPTION OF PROCEDURE:  The patient was laid supine.  Under general anesthesia, the right leg prepped and draped in usual sterile fashion. A 4-cm incision was made from the inferior pole of patella to the tibial tubercle.  A new blade was used to make a median parapatellar arthrotomy and performed synovectomy.  The arthrotomy was made from 3 o'clock in the patella down to the tibial tubercle.  I removed the anterior fat pad on the medial side.  I then removed the anterior lip of the tibia and then placed the jig into the knee, placed leg in extension with an alignment rod attached.  I took C-arm images at the ankle, knee, and hip to ensure that it reproduced the mechanical axis and pinned the cutting block in place.  I made the distal femoral cut and then a proximal tibial cut.  I removed the jig and then finished the cuts with reciprocating saw.  I then drilled for the lug and cut for the chamfer and the posterior condylar cut.  I then trialed the tibia to 1.  I then drilled 2 lugs after putting down the template there, trialed with a 2 femur and 11 insert and I had good flexion, extension, gap, and balance. Chose these  components removed.  The trial components were copiously irrigated, then cemented and the components were allowed to cement to harden in extension.  I then let the tourniquet down, obtained hemostasis, copiously irrigated.  I then closed the arthrotomy in figure- of-eight #1 Vicryl sutures, deep soft tissue buried with 0 Vicryl sutures, subcuticular 2-0 Vicryl stitch, and skin staples.  Dressed. Our tourniquet time was 60 minutes.         ______________________________ Mila Homer Sherlean Foot, M.D.    SDL/MEDQ  D:  05/20/2012  T:  05/21/2012  Job:  [id number]

## 2012-05-21 NOTE — Discharge Summary (Signed)
Natalie Spurling, MD   Altamese Cabal, PA-C [address] [place], [place], [place]  [address]                             [phone]  PATIENT ID: Natalie Fletcher        MRN:  [id number]          DOB/AGE: 63-Jan-1952 / 63 y.o.    DISCHARGE SUMMARY  ADMISSION DATE:    05/20/2012 DISCHARGE DATE:   05/21/2012   ADMISSION DIAGNOSIS: osteoarthritis right knee    DISCHARGE DIAGNOSIS:  osteoarthritis right knee    ADDITIONAL DIAGNOSIS: Active Problems:  * No active hospital problems. *   Past Medical History  Diagnosis Date  . GERD (gastroesophageal reflux disease)   . Arthritis     PROCEDURE: Procedure(s): UNICOMPARTMENTAL KNEE on 05/20/2012  CONSULTS:     HISTORY:  See H&P in chart  HOSPITAL COURSE:  Natalie Fletcher is a 61 y.o. admitted on 05/20/2012 and found to have a diagnosis of osteoarthritis right knee.  After appropriate laboratory studies were obtained  they were taken to the operating room on 05/20/2012 and underwent Procedure(s): UNICOMPARTMENTAL KNEE.   They were given perioperative antibiotics:  Anti-infectives     Start     Dose/Rate Route Frequency Ordered Stop   05/20/12 1800   ceFAZolin (ANCEF) IVPB 1 g/50 mL premix        1 g 100 mL/hr over 30 Minutes Intravenous Every 6 hours 05/20/12 1636 05/21/12 0045   05/19/12 1345   ceFAZolin (ANCEF) IVPB 2 g/50 mL premix        2 g 100 mL/hr over 30 Minutes Intravenous 60 min pre-op 05/19/12 1345 05/20/12 0845        .  Tolerated the procedure well.  Placed with a foley intraoperatively.  Given Ofirmev at induction and for 48 hours.    POD #1, allowed out of bed to a chair.  PT for ambulation and exercise program.  Foley D/C'd in morning.  IV saline locked.  O2 discontionued.   .  The remainder of the hospital course was dedicated to ambulation and strengthening.   The patient was discharged on 1 Day Post-Op in  Good condition.  Blood products given:none  DIAGNOSTIC STUDIES: Recent vital signs: Patient Vitals  for the past 24 hrs:  BP Temp Temp src Pulse Resp SpO2  05/21/12 1600 - - - - 16  96 %  05/21/12 1231 127/67 mmHg - - 69  18  95 %  05/21/12 1200 - - - - 18  95 %  05/21/12 0800 - - - - 16  95 %  05/21/12 0648 135/69 mmHg 97.2 F (36.2 C) Oral 90  18  100 %  05/21/12 0150 119/63 mmHg 97.3 F (36.3 C) Oral 75  15  100 %  05/20/12 2200 135/69 mmHg 97.2 F (36.2 C) Oral 90  14  100 %  05/20/12 1900 - - - - - 100 %       Recent laboratory studies:  [facility] 05/21/12 0605  WBC 6.8  HGB 10.6*  HCT 32.7*  PLT 173    Basename 05/21/12 0605  NA 138  K 3.5  CL 102  CO2 29  BUN 9  CREATININE 0.76  GLUCOSE 94  CALCIUM 8.5   Lab Results  Component Value Date   INR 0.91 05/13/2012     Recent Radiographic Studies :  Dg  Chest 2 View  05/13/2012  *RADIOLOGY REPORT*  Clinical Data: Preop radiograph  CHEST - 2 VIEW  Comparison: None  Findings: Heart size is normal.  No pleural effusion or edema.  No airspace consolidation identified.  Capsular calcification involving bilateral breast implants noted.  IMPRESSION:  1.  No acute cardiopulmonary abnormalities.  Original Report Authenticated By: Rosealee Albee, M.D.   Dg C-arm 1-60 Min-no Report  05/20/2012  CLINICAL DATA: unicompartmental total RT knee   C-ARM 1-60 MINUTES  Fluoroscopy was utilized by the requesting physician.  No radiographic  interpretation.      DISCHARGE INSTRUCTIONS: Discharge Orders    Future Orders Please Complete By Expires   Diet - low sodium heart healthy      Call MD / Call 911      Comments:   If you experience chest pain or shortness of breath, CALL 911 and be transported to the hospital emergency room.  If you develope a fever above 101 F, pus (white drainage) or increased drainage or redness at the wound, or calf pain, call your surgeon's office.   Constipation Prevention      Comments:   Drink plenty of fluids.  Prune juice may be helpful.  You may use a stool softener, such as Colace (over the  counter) 100 mg twice a day.  Use MiraLax (over the counter) for constipation as needed.   Increase activity slowly as tolerated      Driving restrictions      Comments:   No driving for 6 weeks   Lifting restrictions      Comments:   No lifting for 6 weeks   CPM      Comments:   Continuous passive motion machine (CPM):      Use the CPM from 0 to 90 for 6-8 hours per day.      You may increase by 10 per day.  You may break it up into 2 or 3 sessions per day.      Use CPM for 2 weeks or until you are told to stop.   TED hose      Comments:   Use stockings (TED hose) for 2 weeks on both leg(s).  You may remove them at night for sleeping.   Change dressing      Comments:   Change dressing on wednesday, then change the dressing daily with sterile 4 x 4 inch gauze dressing and apply TED hose.  You may clean the incision with alcohol prior to redressing.   Do not put a pillow under the knee. Place it under the heel.         DISCHARGE MEDICATIONS:   Medication List  As of 05/21/2012  5:05 PM   TAKE these medications         acyclovir 400 MG tablet   Commonly known as: ZOVIRAX   Take 400 mg by mouth 3 (three) times daily as needed. For cold sores      buPROPion 150 MG 12 hr tablet   Commonly known as: WELLBUTRIN SR   Take 1 tablet (150 mg total) by mouth 2 (two) times daily.      CALCIUM PO   Take 1 tablet by mouth 2 (two) times daily.      celecoxib 200 MG capsule   Commonly known as: CELEBREX   Take 1 capsule (200 mg total) by mouth every 12 (twelve) hours.      enoxaparin 40 MG/0.4ML injection   Commonly known  as: LOVENOX   Inject 0.4 mLs (40 mg total) into the skin daily.      methocarbamol 500 MG tablet   Commonly known as: ROBAXIN   Take 1 tablet (500 mg total) by mouth every 6 (six) hours as needed.      ondansetron 4 MG tablet   Commonly known as: ZOFRAN   Take 1 tablet (4 mg total) by mouth every 6 (six) hours as needed for nausea.      oxyCODONE 5 MG immediate  release tablet   Commonly known as: Oxy IR/ROXICODONE   Take 1-2 tablets (5-10 mg total) by mouth every 3 (three) hours as needed.      pantoprazole 40 MG tablet   Commonly known as: PROTONIX   TAKE 1 TABLET BY MOUTH EVERY DAY      VITAMIN D-3 PO   Take 1 capsule by mouth daily.            FOLLOW UP VISIT:   Follow-up Information    Follow up with Raymon Mutton, MD. Call on 06/04/2012.   Contact information:   [address] [place] [address] [phone]          DISPOSITION:  Home    CONDITION:  Good   Carinne Brandenburger 05/21/2012, 5:05 PM

## 2012-05-21 NOTE — Progress Notes (Signed)
Physical Therapy Treatment Patient Details Name: Natalie Fletcher MRN: [id number] DOB: Jun 18, 1951 Today's Date: 05/21/2012 Time: 4098-1191 PT Time Calculation (min): 18 min  PT Assessment / Plan / Recommendation Comments on Treatment Session  Pt progressing well, near mod I for all mobility. Pt and daughter educated on proper stair sequencing and safety for possible d/c home today.    Follow Up Recommendations  Home health PT    Barriers to Discharge        Equipment Recommendations  None recommended by PT    Recommendations for Other Services    Frequency 7X/week   Plan Discharge plan remains appropriate;Frequency remains appropriate    Precautions / Restrictions Restrictions Weight Bearing Restrictions: Yes RLE Weight Bearing: Weight bearing as tolerated       Mobility  Bed Mobility Bed Mobility: Supine to Sit;Sitting - Scoot to Edge of Bed;Sit to Supine Supine to Sit: 6: Modified independent (Device/Increase time) Sitting - Scoot to Edge of Bed: 6: Modified independent (Device/Increase time) Sit to Supine: 6: Modified independent (Device/Increase time) Transfers Transfers: Sit to Stand;Stand to Sit Sit to Stand: 5: Supervision;With upper extremity assist;From bed;From chair/3-in-1 Stand to Sit: 5: Supervision;With upper extremity assist;To bed;To chair/3-in-1 Details for Transfer Assistance: VC for proper hand placement and safety to/from RW Ambulation/Gait Ambulation/Gait Assistance: 5: Supervision Ambulation Distance (Feet): 15 Feet Assistive device: Rolling walker Ambulation/Gait Assistance Details: VC for proper sequencing and safety with RW Gait Pattern: Antalgic General Gait Details: Daughter and pt educated on proper stair sequencing and safety Stairs: Yes Stairs Assistance: 4: Min guard Stair Management Technique: No rails;Backwards;With walker Number of Stairs: 3       PT Goals Acute Rehab PT Goals PT Goal Formulation: With patient Time For Goal  Achievement: 05/24/12 Potential to Achieve Goals: Good Pt will go Sit to Stand: with modified independence PT Goal: Sit to Stand - Progress: Progressing toward goal Pt will go Stand to Sit: with modified independence PT Goal: Stand to Sit - Progress: Progressing toward goal Pt will Ambulate: >150 feet;with modified independence;with rolling walker PT Goal: Ambulate - Progress: Progressing toward goal Pt will Go Up / Down Stairs: 1-2 stairs;with least restrictive assistive device;with supervision PT Goal: Up/Down Stairs - Progress: Met Pt will Perform Home Exercise Program: with supervision, verbal cues required/provided PT Goal: Perform Home Exercise Program - Progress: Goal set today  Visit Information  Last PT Received On: 05/21/12 Assistance Needed: +1    Subjective Data      Cognition  Overall Cognitive Status: Appears within functional limits for tasks assessed/performed Arousal/Alertness: Awake/alert Orientation Level: Appears intact for tasks assessed Behavior During Session: [facility] for tasks performed    Balance     End of Session PT - End of Session Equipment Utilized During Treatment: Gait belt Activity Tolerance: Patient tolerated treatment well Patient left: in bed;with call bell/phone within reach Nurse Communication: Mobility status     Milana Kidney 05/21/2012, 2:53 PM  05/21/2012 Milana Kidney DPT PAGER: [phone] OFFICE: [phone]

## 2012-05-21 NOTE — Evaluation (Signed)
Physical Therapy Evaluation Patient Details Name: Natalie Fletcher MRN: [id number] DOB: [birth year] Today's Date: 05/21/2012 Time: 7829-5621 PT Time Calculation (min): 41 min  PT Assessment / Plan / Recommendation Clinical Impression  61 y/o WF s/p R unicompartmental knee on 05/20/12.  Pt would benefit from PT to work towards increasing level of independence.  Anticipate pt should do well and d/c home with HHPT.    PT Assessment  Patient needs continued PT services    Follow Up Recommendations  Home health PT            Equipment Recommendations  None recommended by PT         Frequency 7X/week    Precautions / Restrictions Restrictions Weight Bearing Restrictions: Yes RLE Weight Bearing: Weight bearing as tolerated   Pertinent Vitals/Pain WNL, pain minimal with request for pain med at end of session to "stay ahead of the pain."      Mobility  Bed Mobility Bed Mobility: Supine to Sit Supine to Sit: 6: Modified independent (Device/Increase time) Details for Bed Mobility Assistance: bed flat without rails Transfers Transfers: Sit to Stand;Stand to Sit Sit to Stand: 5: Supervision (cues for hand placement) Stand to Sit: 5: Supervision Ambulation/Gait Ambulation/Gait Assistance: 5: Supervision;4: Min guard Ambulation Distance (Feet): 175 Feet Assistive device: Rolling walker Ambulation/Gait Assistance Details: cues for sequencing, posture, and for RW placement Gait Pattern: Antalgic General Gait Details: Pt c/o arm fatigue.  antalgic gait towards end of gait.    Exercises Total Joint Exercises Ankle Circles/Pumps: AROM;Both;20 reps;Supine Quad Sets: Strengthening;Right;10 reps;Supine Heel Slides: AROM;Right;10 reps;Supine Hip ABduction/ADduction: AROM;Right;10 reps;Supine Straight Leg Raises: AROM;Strengthening;Right;10 reps;Supine Goniometric ROM: pt with ~ 80 degrees AROM R knee flex and able to achieve full extension with quad set.   PT Diagnosis: Difficulty  walking  PT Problem List: Decreased range of motion;Decreased mobility;Decreased knowledge of use of DME PT Treatment Interventions: DME instruction;Gait training;Stair training;Functional mobility training;Therapeutic activities;Therapeutic exercise   PT Goals Acute Rehab PT Goals PT Goal Formulation: With patient Time For Goal Achievement: 05/24/12 Potential to Achieve Goals: Good Pt will go Sit to Stand: with modified independence PT Goal: Sit to Stand - Progress: Goal set today Pt will go Stand to Sit: with modified independence PT Goal: Stand to Sit - Progress: Goal set today Pt will Ambulate: >150 feet;with modified independence;with rolling walker PT Goal: Ambulate - Progress: Goal set today Pt will Go Up / Down Stairs: 1-2 stairs;with least restrictive assistive device;with supervision PT Goal: Up/Down Stairs - Progress: Goal set today Pt will Perform Home Exercise Program: with supervision, verbal cues required/provided PT Goal: Perform Home Exercise Program - Progress: Goal set today  Visit Information  Last PT Received On: 05/21/12 Assistance Needed: +1    Subjective Data  Subjective: "I want to go home today." Patient Stated Goal: Go home today.   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 Communication Communication: No difficulties Dominant Hand: Right    Cognition  Overall Cognitive Status: Appears within  functional limits for tasks assessed/performed Arousal/Alertness: Awake/alert Orientation Level: Appears intact for tasks assessed Behavior  During Session: [facility] for tasks performed    Extremity/Trunk Assessment Right Lower Extremity Assessment RLE ROM/Strength/Tone: [facility] for tasks assessed RLE Sensation: WFL - Light Touch RLE Coordination: WFL - gross/fine motor Left Lower Extremity Assessment LLE ROM/Strength/Tone: WFL for tasks assessed LLE Sensation: WFL - Light Touch LLE Coordination: WFL - gross/fine motor Trunk Assessment Trunk Assessment: Normal       End of Session PT - End of Session Equipment Utilized During Treatment: Gait belt Activity Tolerance: Patient tolerated treatment well Patient left: in chair;with call bell/phone within reach;with family/visitor present  GP     Denesha Brouse LUBECK 05/21/2012, 10:57 AM

## 2012-05-21 NOTE — Care Management Note (Signed)
    Page 1 of 1   05/21/2012     10:56:57 AM   CARE MANAGEMENT NOTE 05/21/2012  Patient:  Natalie Fletcher, Natalie Fletcher   Account Number:  [id number]  Date Initiated:  05/21/2012  Documentation initiated by:  Anette Guarneri  Subjective/Objective Assessment:   POD#1 s/p right TKA  lives at home with spouse  will need hhpt  has DME     Action/Plan:   home with [facility] services   Anticipated DC Date:  05/23/2012   Anticipated DC Plan:  HOME W HOME HEALTH SERVICES      DC Planning Services  CM consult      Choice offered to / List presented to:             Status of service:  Completed, signed off Medicare Important Message given?  NO (If response is "NO", the following Medicare IM given date fields will be blank) Date Medicare IM given:   Date Additional Medicare IM given:    Discharge Disposition:  HOME W HOME HEALTH SERVICES  Per UR Regulation:  Reviewed for med. necessity/level of care/duration of stay  If discussed at Long Length of Stay Meetings, dates discussed:    Comments:  05/21/12  10:54  Anette Guarneri RN/CM plans to d/c home with husband, has RW/CPM/BSC MD office pre-arranged [facility] services with [facility].

## 2012-05-21 NOTE — Progress Notes (Signed)
Occupational Therapy Evaluation Patient Details Name: Natalie Fletcher MRN: [id number] DOB: 1951-03-07 Today's Date: 05/21/2012 Time: 6213-0865 OT Time Calculation (min): 23 min  OT Assessment / Plan / Recommendation Clinical Impression  61 yo s/p R unicompartmental knee replacement. All education regarding ADL and funcitonal mobility for ADL completted. No further Ot indicated/    OT Assessment  Patient does not need any further OT services    Follow Up Recommendations  No OT follow up    Barriers to Discharge      Equipment Recommendations  None recommended by PT    Recommendations for Other Services    Frequency       Precautions / Restrictions Restrictions Weight Bearing Restrictions: Yes RLE Weight Bearing: Weight bearing as tolerated   Pertinent Vitals/Pain none    ADL  Grooming: Performed;Modified independent Where Assessed - Grooming: Unsupported standing Upper Body Bathing: Simulated;Set up Lower Body Bathing: Simulated;Minimal assistance Where Assessed - Lower Body Bathing: Unsupported sit to stand Upper Body Dressing: Simulated;Set up Where Assessed - Upper Body Dressing: Unsupported sitting Lower Body Dressing: Simulated;Minimal assistance Where Assessed - Lower Body Dressing: Unsupported sit to stand Toilet Transfer: Performed;Modified independent Toilet Transfer Method: Sit to stand;Stand pivot Toilet Transfer Equipment: Comfort height toilet Toileting - Clothing Manipulation and Hygiene: Modified independent Equipment Used: Rolling walker;Reacher;Long-handled sponge;Sock aid Transfers/Ambulation Related to ADLs: Mod I RW level ADL Comments: Educated pt on availability and use of AE.    OT Diagnosis:    OT Problem List:   OT Treatment Interventions:     OT Goals Acute Rehab OT Goals OT Goal Formulation:  (eval only)  Visit Information  Last OT Received On: 05/21/12 Assistance Needed: +1    Subjective Data      Prior  Functioning  Vision/Perception  Home Living Lives With: Spouse Available Help at Discharge: Family;Friend(s) Type of Home: House Home Access: Stairs to enter [employer] of Steps: 2  Entrance Stairs-Rails: None Home Layout: One level Bathroom Shower/Tub: Health visitor: Standard Bathroom Accessibility: Yes How Accessible: Accessible via walker Home Adaptive Equipment: Bedside commode/3-in-1;Walker - rolling;Other (comment) (CPM) Prior Function Level of Independence: Independent Able to Take Stairs?: Yes Driving: Yes 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 for tasks performed    Extremity/Trunk Assessment Right Upper Extremity Assessment RUE ROM/Strength/Tone: Within functional levels Left Upper Extremity Assessment LUE ROM/Strength/Tone: Within functional levels Right Lower Extremity Assessment RLE ROM/Strength/Tone: WFL for tasks assessed RLE Sensation: WFL - Light Touch RLE Coordination: WFL - gross/fine motor Left Lower Extremity Assessment LLE ROM/Strength/Tone: WFL for tasks assessed LLE Sensation: WFL - Light Touch LLE Coordination: WFL - gross/fine motor Trunk Assessment Trunk Assessment: Normal   Mobility Bed Mobility Bed Mobility: Supine to Sit Supine to Sit: 6: Modified independent (Device/Increase time) Details for Bed Mobility Assistance: bed flat without rails Transfers Sit to Stand: 6: Modified independent (Device/Increase time) (cues for hand placement) Stand to Sit: 6: Modified independent (Device/Increase time)   Exercise Total Joint Exercises Ankle Circles/Pumps: AROM;Both;20 reps;Supine Quad Sets: Strengthening;Right;10  reps;Supine Heel Slides: AROM;Right;10 reps;Supine Hip ABduction/ADduction: AROM;Right;10 reps;Supine Straight Leg Raises: AROM;Strengthening;Right;10 reps;Supine Goniometric ROM: pt with ~ 80 degrees AROM R knee flex and able to achieve full extension with quad set.  Balance Balance Balance Assessed:  (WFL)  End of  Session OT - End of Session Activity Tolerance: Patient tolerated treatment well Patient left: in chair;with call bell/phone within reach;with family/visitor present Nurse Communication: Mobility status  GO     Alcus Bradly,HILLARY 05/21/2012, 11:22 AM Luisa Dago, OTR/L  [phone] 05/21/2012

## 2012-07-16 ENCOUNTER — Ambulatory Visit (INDEPENDENT_AMBULATORY_CARE_PROVIDER_SITE_OTHER): Payer: 59 | Admitting: Family Medicine

## 2012-07-16 DIAGNOSIS — Z23 Encounter for immunization: Secondary | ICD-10-CM

## 2012-08-27 ENCOUNTER — Other Ambulatory Visit: Payer: Self-pay | Admitting: Internal Medicine

## 2012-09-07 ENCOUNTER — Other Ambulatory Visit: Payer: Self-pay | Admitting: Internal Medicine

## 2012-09-08 ENCOUNTER — Other Ambulatory Visit: Payer: Self-pay | Admitting: Internal Medicine

## 2012-09-20 ENCOUNTER — Other Ambulatory Visit: Payer: Self-pay | Admitting: Internal Medicine

## 2012-10-02 ENCOUNTER — Other Ambulatory Visit: Payer: Self-pay | Admitting: Internal Medicine

## 2012-10-28 ENCOUNTER — Other Ambulatory Visit: Payer: Self-pay | Admitting: Obstetrics and Gynecology

## 2012-10-28 DIAGNOSIS — Z1231 Encounter for screening mammogram for malignant neoplasm of breast: Secondary | ICD-10-CM

## 2012-12-05 ENCOUNTER — Other Ambulatory Visit: Payer: Self-pay | Admitting: Gastroenterology

## 2013-01-15 ENCOUNTER — Telehealth: Payer: Self-pay | Admitting: Gastroenterology

## 2013-01-15 NOTE — Telephone Encounter (Signed)
Pt scheduled to see Dr. Arlyce Dice 01/27/13@9 :30am. Pt aware of appt date and time.

## 2013-01-23 ENCOUNTER — Ambulatory Visit
Admission: RE | Admit: 2013-01-23 | Discharge: 2013-01-23 | Disposition: A | Discharge status: Home / Self Care | Payer: 59 | Source: Ambulatory Visit | Attending: Obstetrics and Gynecology | Admitting: Obstetrics and Gynecology

## 2013-01-23 DIAGNOSIS — Z1231 Encounter for screening mammogram for malignant neoplasm of breast: Secondary | ICD-10-CM

## 2013-01-27 ENCOUNTER — Encounter: Payer: Self-pay | Admitting: Gastroenterology

## 2013-01-27 ENCOUNTER — Ambulatory Visit (INDEPENDENT_AMBULATORY_CARE_PROVIDER_SITE_OTHER): Payer: 59 | Admitting: Gastroenterology

## 2013-01-27 VITALS — BP 134/90 | HR 64 | Ht 62.0 in | Wt 184.6 lb

## 2013-01-27 DIAGNOSIS — K5900 Constipation, unspecified: Secondary | ICD-10-CM

## 2013-01-27 DIAGNOSIS — K222 Esophageal obstruction: Secondary | ICD-10-CM

## 2013-01-27 NOTE — Patient Instructions (Addendum)
You have been scheduled for a Barium Esophogram at [facility] (1st floor of the hospital) on 01/29/2013 at 10:30am. Please arrive 15 minutes prior to your appointment for registration. Make certain not to have anything to eat or drink 6 hours prior to your test. If you need to reschedule for any reason, please contact radiology at [phone] to do so. __________________________________________________________________ A barium swallow is an examination that concentrates on views of the esophagus. This tends to be a double contrast exam (barium and two liquids which, when combined, create a gas to distend the wall of the oesophagus) or single contrast (non-ionic iodine based). The study is usually tailored to your symptoms so a good history is essential. Attention is paid during the study to the form, structure and configuration of the esophagus, looking for functional disorders (such as aspiration, dysphagia, achalasia, motility and reflux) EXAMINATION You may be asked to change into a gown, depending on the type of swallow being performed. A radiologist and radiographer will perform the procedure. The radiologist will advise you of the type of contrast selected for your procedure and direct you during the exam. You will be asked to stand, sit or lie in several different positions and to hold a small amount of fluid in your mouth before being asked to swallow while the imaging is performed .In some instances you may be asked to swallow barium coated marshmallows to assess the motility of a solid food bolus. The exam can be recorded as a digital or video fluoroscopy procedure. POST PROCEDURE It will take 1-2 days for the barium to pass through your system. To facilitate this, it is important, unless otherwise directed, to increase your fluids for the next 24-48hrs and to resume your normal diet.  This test typically takes about 30 minutes to  perform. __________________________________________________________________________________

## 2013-01-27 NOTE — Assessment & Plan Note (Addendum)
Patient relates  intermittent dysphagia to solids. She has a history of a stricture and is status post dilation in 2009  Recommendations #1 barium swallow

## 2013-01-27 NOTE — Progress Notes (Signed)
History of Present Illness: Pleasant 62 year old white female referred for evaluation of constipation. This is been a chronic problem for years. She does not have a spontaneous bowel movement up to 7 days at a time. With constipation she has abdominal bloating and mild discomfort. With straining she may see a small amount of blood on the toilet tissue. She has used various stool softeners and fiber agents with minimal relief. Colonoscopy in 2009 demonstrated diverticulosis and melanosis.  The patient also complains of intermittent dysphagia to solids. She underwent dilatation of an early esophageal stricture in 2009.    Past Medical History  Diagnosis Date  . GERD (gastroesophageal reflux disease)   . Arthritis    Past Surgical History  Procedure Laterality Date  . Partial knee arthroplasty  05/20/2012    Procedure: UNICOMPARTMENTAL KNEE;  Surgeon: Raymon Mutton, MD;  Location: [facility] OR;  Service: Orthopedics;  Laterality: Right;  Uni Medial Compartmental Right Knee    family history includes Breast cancer in her maternal aunt; COPD in her father; Kidney cancer (age of onset: 26) in her maternal grandmother; and Kidney cancer (age of onset: 37) in her mother. Current Outpatient Prescriptions  Medication Sig Dispense Refill  . acyclovir (ZOVIRAX) 400 MG tablet TAKE 1 TABLET BY MOUTH 3 TIMES A DAY AS NEEDED HERPES FOR 7 DAYS  21 tablet  0  . buPROPion (WELLBUTRIN SR) 150 MG 12 hr tablet TAKE 1 TABLET (150 MG TOTAL) BY MOUTH 2 (TWO) TIMES DAILY.  180 tablet  2  . CALCIUM PO Take 1 tablet by mouth 2 (two) times daily.      . Cholecalciferol (VITAMIN D-3 PO) Take 1 capsule by mouth daily.      Marland Kitchen docusate sodium (COLACE) 100 MG capsule Take 100 mg by mouth 2 (two) times daily.      Marland Kitchen enoxaparin (LOVENOX) 40 MG/0.4ML injection Inject 0.4 mLs (40 mg total) into the skin daily.  13 Syringe  0  . pantoprazole (PROTONIX) 40 MG tablet TAKE 1 TABLET BY MOUTH EVERY DAY 30 MINUTES BEFORE A MEAL  90 tablet  0    No current facility-administered medications for this visit.   Allergies as of 01/27/2013 - Review Complete 01/27/2013  Allergen Reaction Noted  . Codeine Nausea And Vomiting 04/11/2012    reports that she has never smoked. She has never used smokeless tobacco. She reports that she drinks about 0.6 ounces of alcohol per week. She reports that she does not use illicit drugs.     Review of Systems: He complains of mild fatigue Pertinent positive and negative review of systems were noted in the above HPI section. All other review of systems were otherwise negative.  Vital signs were reviewed in today's medical record Physical Exam: General: Well developed , well nourished, no acute distress Skin: anicteric Head: Normocephalic and atraumatic Eyes:  sclerae anicteric, EOMI Ears: Normal auditory acuity Mouth: No deformity or lesions Neck: Supple, no masses or thyromegaly Lungs: Clear throughout to auscultation Heart: Regular rate and rhythm; no murmurs, rubs or bruits Abdomen: Soft, non tender and non distended. No masses, hepatosplenomegaly or hernias noted. Normal Bowel sounds Rectal: There are no external lesions Musculoskeletal: Symmetrical with no gross deformities  Skin: No lesions on visible extremities Pulses:  Normal pulses noted Extremities: No clubbing, cyanosis, edema or deformities noted Neurological: Alert oriented x 4, grossly nonfocal Cervical Nodes:  No significant cervical adenopathy Inguinal Nodes: No significant inguinal adenopathy Psychological:  Alert and cooperative. Normal mood and affect

## 2013-01-28 DIAGNOSIS — K5900 Constipation, unspecified: Secondary | ICD-10-CM | POA: Insufficient documentation

## 2013-01-28 NOTE — Assessment & Plan Note (Signed)
She has chronic idiopathic constipation.  Recommendations #1 patient will consider enrollment in a constipation trial

## 2013-01-29 ENCOUNTER — Ambulatory Visit (HOSPITAL_COMMUNITY)
Admission: RE | Admit: 2013-01-29 | Discharge: 2013-01-29 | Disposition: A | Discharge status: Home / Self Care | Payer: 59 | Source: Ambulatory Visit | Attending: Gastroenterology | Admitting: Gastroenterology

## 2013-01-29 DIAGNOSIS — K222 Esophageal obstruction: Secondary | ICD-10-CM

## 2013-01-29 DIAGNOSIS — R1310 Dysphagia, unspecified: Secondary | ICD-10-CM | POA: Insufficient documentation

## 2013-02-13 ENCOUNTER — Ambulatory Visit: Payer: Self-pay

## 2013-03-08 ENCOUNTER — Other Ambulatory Visit: Payer: Self-pay | Admitting: Internal Medicine

## 2013-03-31 ENCOUNTER — Other Ambulatory Visit: Payer: Self-pay | Admitting: Gastroenterology

## 2013-03-31 ENCOUNTER — Other Ambulatory Visit: Payer: Self-pay | Admitting: Internal Medicine

## 2013-06-03 ENCOUNTER — Other Ambulatory Visit: Payer: Self-pay | Admitting: Internal Medicine

## 2013-06-27 ENCOUNTER — Other Ambulatory Visit: Payer: Self-pay | Admitting: Gastroenterology

## 2013-07-15 ENCOUNTER — Other Ambulatory Visit: Payer: Self-pay | Admitting: Internal Medicine

## 2013-08-27 ENCOUNTER — Other Ambulatory Visit: Payer: Self-pay | Admitting: Internal Medicine

## 2013-09-27 ENCOUNTER — Other Ambulatory Visit: Payer: Self-pay | Admitting: Gastroenterology

## 2013-10-18 ENCOUNTER — Other Ambulatory Visit: Payer: Self-pay | Admitting: Internal Medicine

## 2013-11-07 ENCOUNTER — Other Ambulatory Visit: Payer: Self-pay | Admitting: Gastroenterology

## 2013-11-07 ENCOUNTER — Other Ambulatory Visit: Payer: Self-pay | Admitting: Internal Medicine

## 2013-12-09 ENCOUNTER — Other Ambulatory Visit: Payer: Self-pay | Admitting: Internal Medicine

## 2014-01-07 LAB — HM PAP SMEAR

## 2014-01-07 LAB — HM MAMMOGRAPHY

## 2014-02-12 ENCOUNTER — Other Ambulatory Visit: Payer: Self-pay | Admitting: *Deleted

## 2014-02-12 MED ORDER — ACYCLOVIR 400 MG PO TABS: 400.0000 mg | ORAL_TABLET | Freq: Three times a day (TID) | ORAL | Status: DC

## 2014-04-25 ENCOUNTER — Other Ambulatory Visit: Payer: Self-pay | Admitting: Gastroenterology

## 2014-04-30 IMAGING — MG MM DIGITAL SCREENING W/ IMPLANTS
9 of 12 series · 9 of 28 positions shown · non-contrast
Comparison: Previous exams.

CLINICAL DATA: Screening.

[L MLO (1 of 2)]
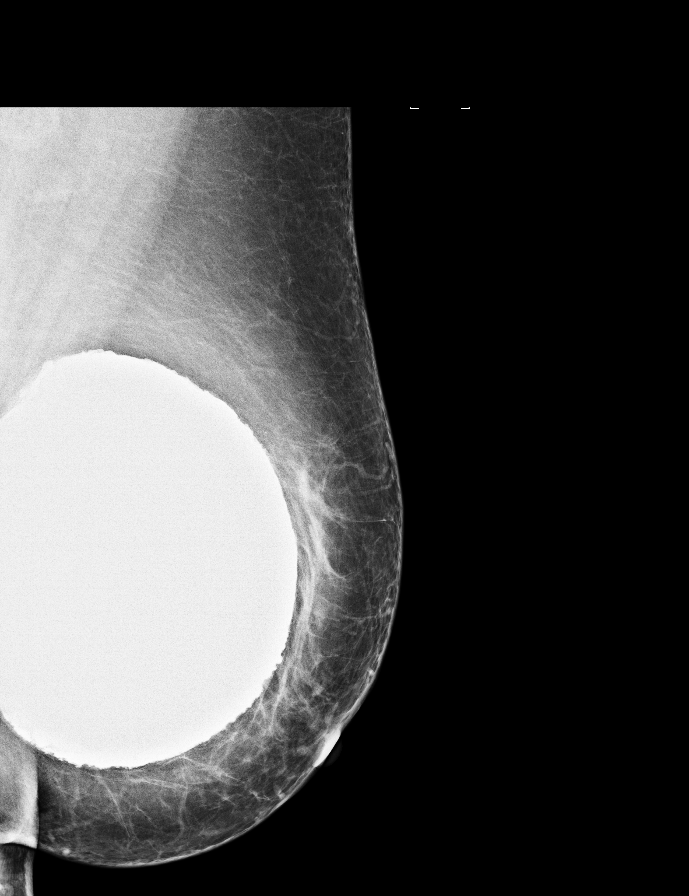

[R CC (1 of 2)]
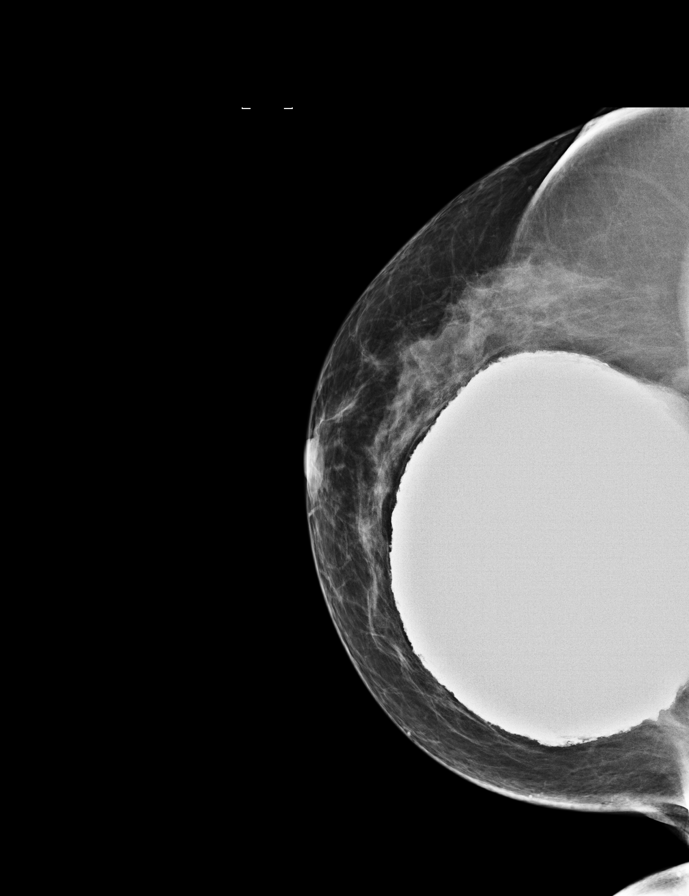

[L CC (1 of 2)]
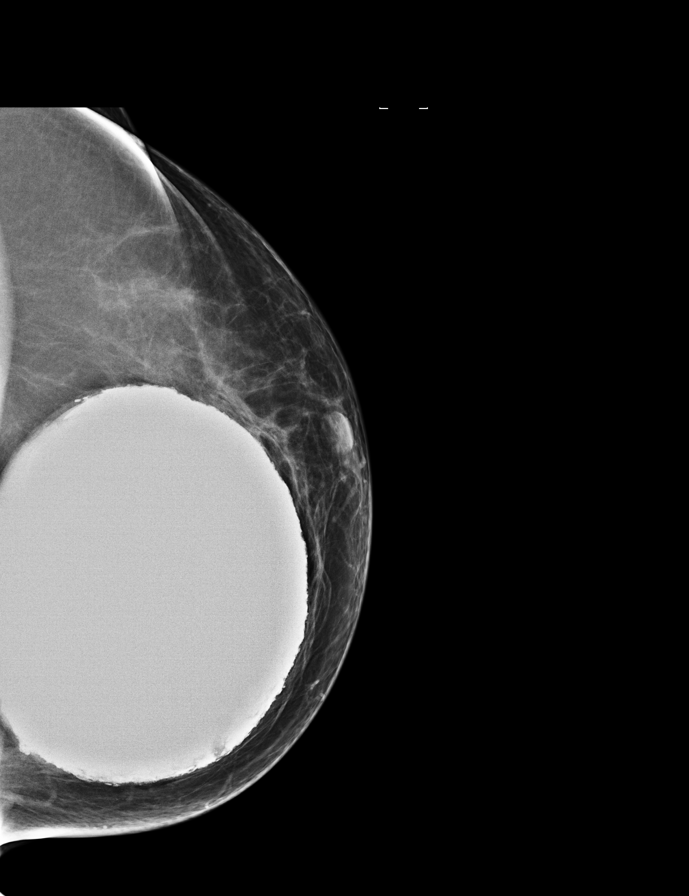

[R MLO (1 of 2)]
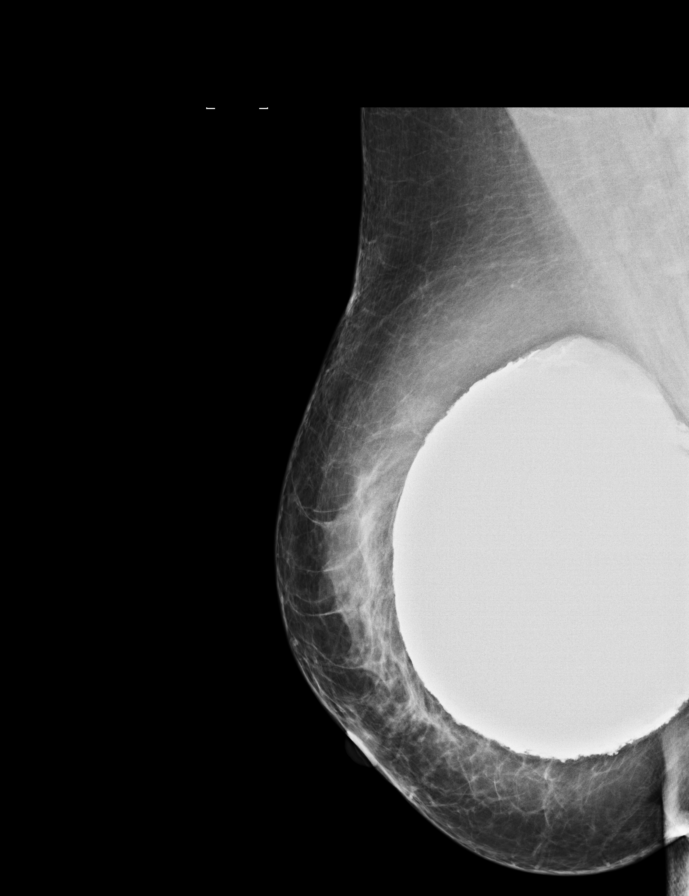

[R CC (2 of 2)]
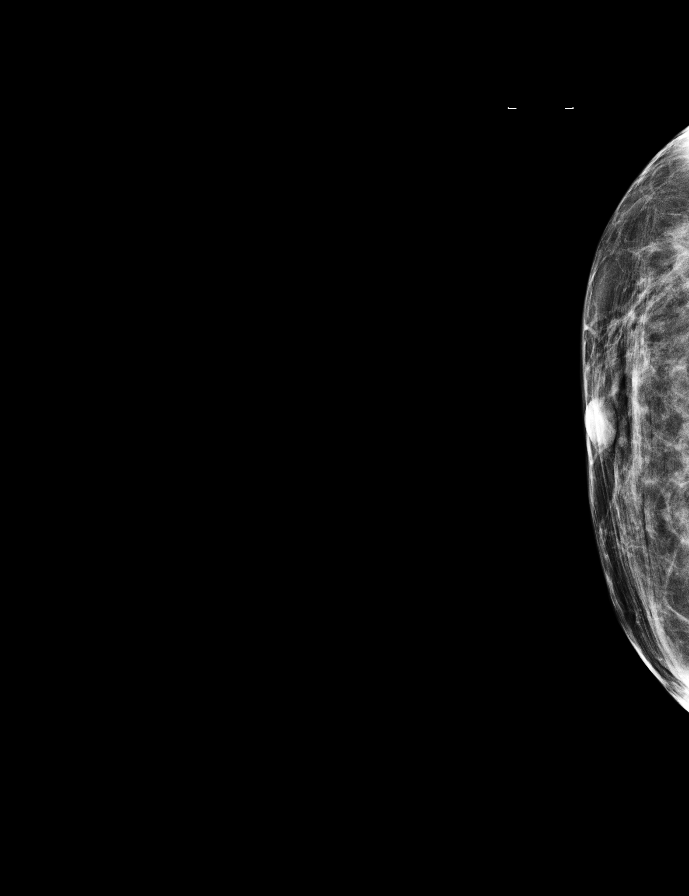

[L CC (2 of 2)]
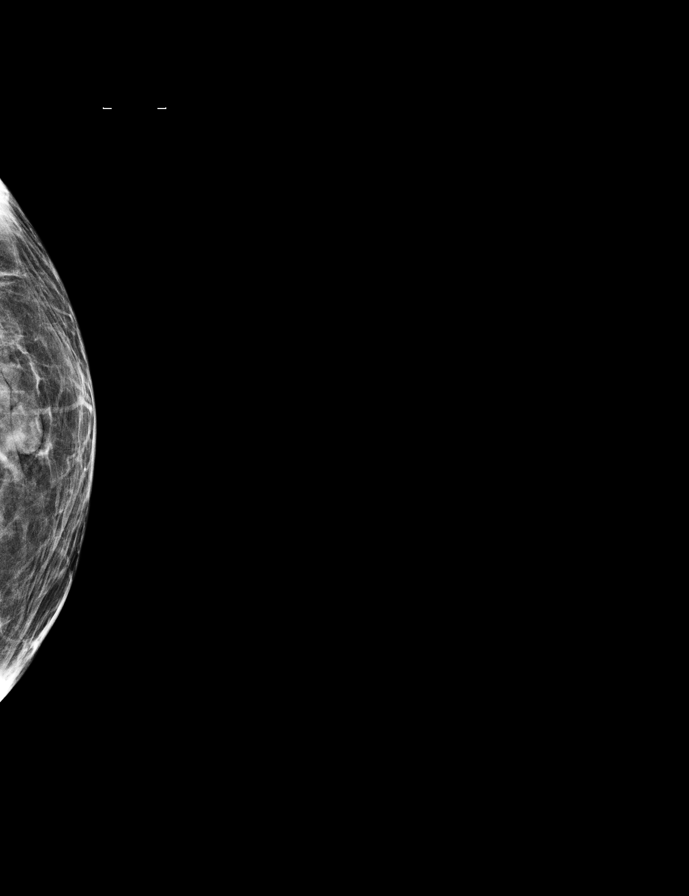

[R MLO (2 of 2)]
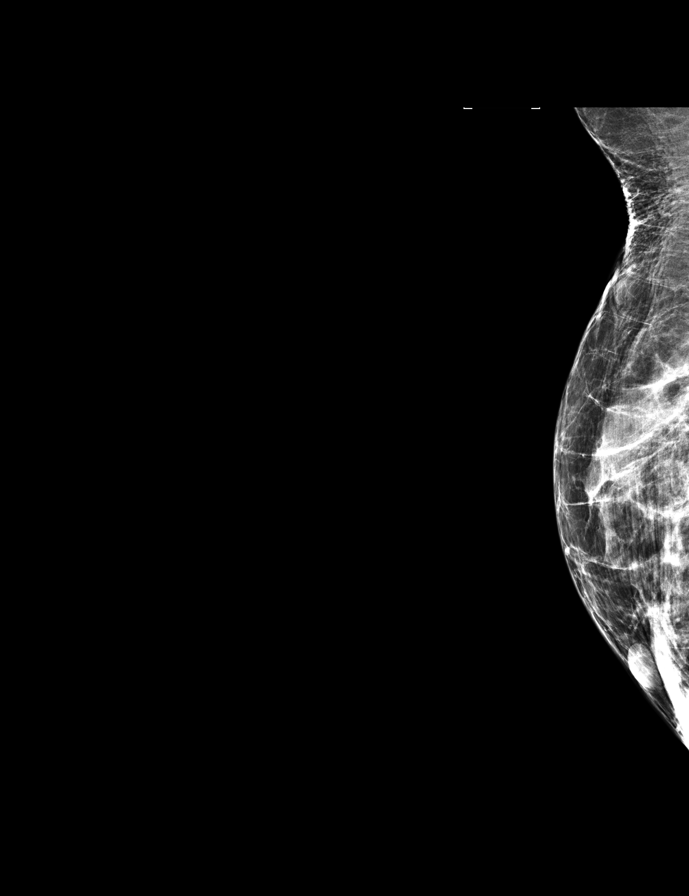

[L MLO (2 of 2)]
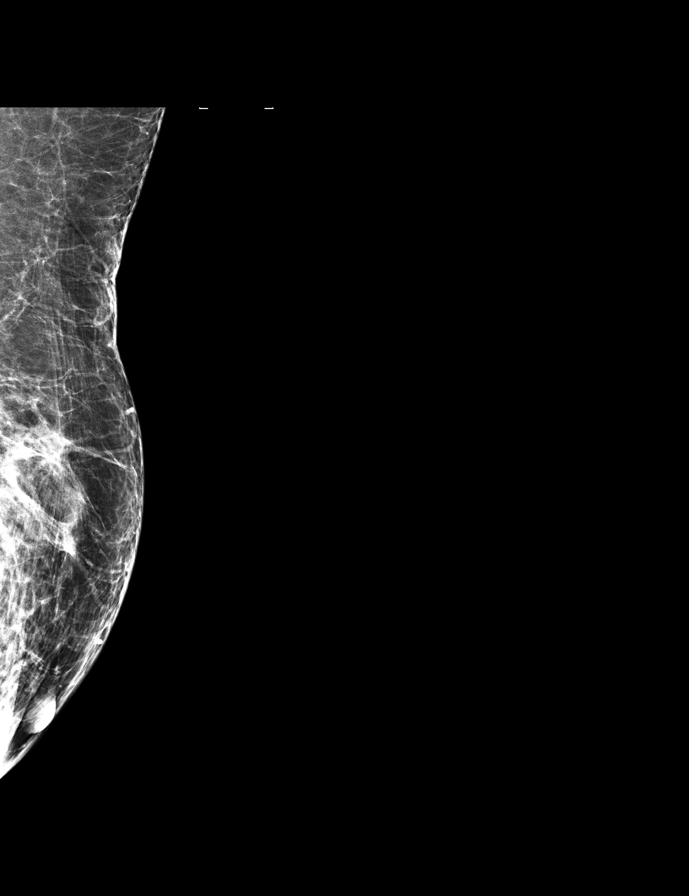

[R CC tomo · tomo slice 22/43.0]
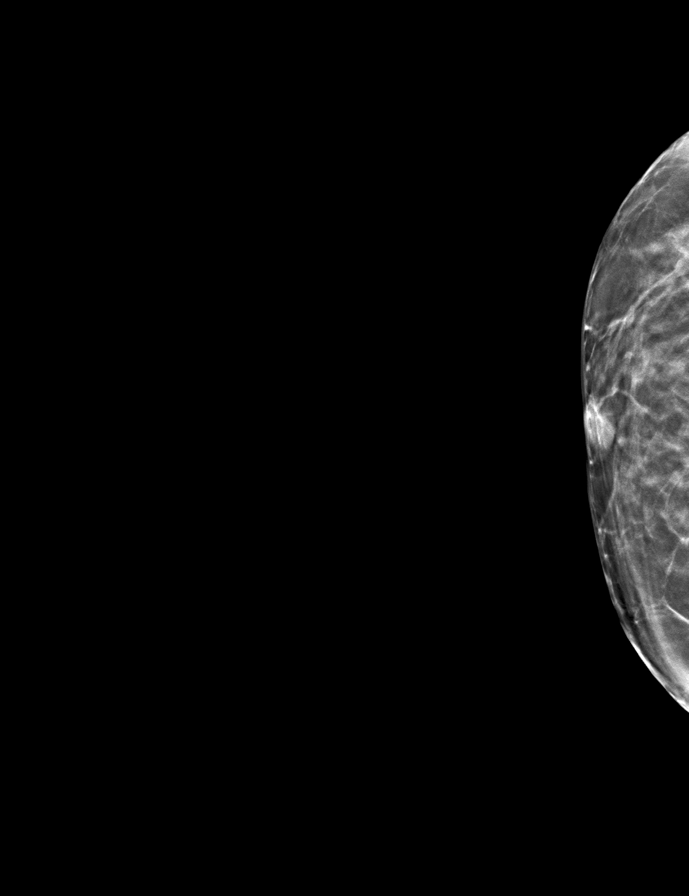

[9 of 28 positions shown; findings below may reference images not displayed]

DIGITAL SCREENING MAMMOGRAM WITH IMPLANTS AND CAD
DIGITAL BREAST TOMOSYNTHESIS

Digital breast tomosynthesis images are acquired in two
projections.  These images are reviewed in combination with the
digital mammogram, confirming the findings below.

The patient has subglandular implants. Standard and implant
displaced views were performed.
FINDINGS: ACR Breast Density Category 2: There is a scattered fibroglandular
pattern.

No suspicious masses, architectural distortion, or calcifications
are present.

Images were processed with CAD.
IMPRESSION: No mammographic evidence of malignancy.

A result letter of this screening mammogram will be mailed directly
to the patient.

RECOMMENDATION:
Screening mammogram in one year. (Code:F2-X-0CN)

BI-RADS CATEGORY 1:  Negative.

## 2014-05-03 ENCOUNTER — Other Ambulatory Visit: Payer: Self-pay | Admitting: Internal Medicine

## 2014-05-13 ENCOUNTER — Other Ambulatory Visit: Payer: Self-pay | Admitting: Internal Medicine

## 2014-05-28 ENCOUNTER — Encounter: Payer: Self-pay | Admitting: Gastroenterology

## 2014-06-23 ENCOUNTER — Ambulatory Visit (INDEPENDENT_AMBULATORY_CARE_PROVIDER_SITE_OTHER): Payer: 59 | Admitting: Internal Medicine

## 2014-06-23 ENCOUNTER — Encounter: Payer: Self-pay | Admitting: Internal Medicine

## 2014-06-23 VITALS — BP 130/78 | HR 80 | Temp 98.2°F | Resp 16 | Ht 62.5 in | Wt 183.0 lb

## 2014-06-23 DIAGNOSIS — Z23 Encounter for immunization: Secondary | ICD-10-CM

## 2014-06-23 DIAGNOSIS — K2100 Gastro-esophageal reflux disease with esophagitis, without bleeding: Secondary | ICD-10-CM

## 2014-06-23 DIAGNOSIS — D649 Anemia, unspecified: Secondary | ICD-10-CM

## 2014-06-23 DIAGNOSIS — R1319 Other dysphagia: Secondary | ICD-10-CM | POA: Insufficient documentation

## 2014-06-23 DIAGNOSIS — Z0000 Encounter for general adult medical examination without abnormal findings: Secondary | ICD-10-CM | POA: Insufficient documentation

## 2014-06-23 MED ORDER — ACYCLOVIR 400 MG PO TABS: 400.0000 mg | ORAL_TABLET | Freq: Three times a day (TID) | ORAL | Status: DC

## 2014-06-23 NOTE — Progress Notes (Signed)
   Subjective:    HPI  The patient is here for a wellness exam.   The patient has been doing well overall without major physical or psychological issues going on lately. C/o fatigue, wt gain, insomnia. She is a donor - once a year  Wt Readings from Last 3 Encounters:  06/23/14 183 lb (83.008 kg)  01/27/13 184 lb 9.6 oz (83.734 kg)  05/13/12 181 lb 1.6 oz (82.146 kg)   BP Readings from Last 3 Encounters:  06/23/14 130/78  01/27/13 134/90  05/21/12 135/67       Review of Systems  Constitutional: Positive for unexpected weight change. Negative for fever, chills, diaphoresis, activity change, appetite change and fatigue.  HENT: Negative for congestion, dental problem, ear pain, hearing loss, mouth sores, postnasal drip, sinus pressure, sneezing, sore throat and voice change.   Eyes: Negative for pain and visual disturbance.  Respiratory: Negative for cough, chest tightness, wheezing and stridor.   Cardiovascular: Negative for chest pain, palpitations and leg swelling.  Gastrointestinal: Negative for nausea, vomiting, abdominal pain, blood in stool, abdominal distention and rectal pain.  Genitourinary: Negative for dysuria, frequency, hematuria, decreased urine volume, vaginal bleeding, vaginal discharge, difficulty urinating, vaginal pain and menstrual problem.  Musculoskeletal: Positive for arthralgias. Negative for back pain, gait problem, joint swelling and neck pain.  Skin: Negative for color change, rash and wound.  Neurological: Negative for dizziness, tremors, syncope, speech difficulty, weakness and light-headedness.  Hematological: Negative for adenopathy.  Psychiatric/Behavioral: Negative for suicidal ideas, hallucinations, behavioral problems, confusion, sleep disturbance, dysphoric mood and decreased concentration. The patient is not nervous/anxious and is not hyperactive.        Objective:   Physical Exam  Constitutional: She appears well-developed. No distress.    Obese  HENT:  Head: Normocephalic.  Right Ear: External ear normal.  Left Ear: External ear normal.  Nose: Nose normal.  Mouth/Throat: Oropharynx is clear and moist.  Eyes: Conjunctivae are normal. Pupils are equal, round, and reactive to light. Right eye exhibits no discharge. Left eye exhibits no discharge.  Neck: Normal range of motion. Neck supple. No JVD present. No tracheal deviation present. No thyromegaly present.  Cardiovascular: Normal rate, regular rhythm and normal heart sounds.   Pulmonary/Chest: No stridor. No respiratory distress. She has no wheezes.  Abdominal: Soft. Bowel sounds are normal. She exhibits no distension and no mass. There is no tenderness. There is no rebound and no guarding.  Musculoskeletal: She exhibits no edema and no tenderness.  Lymphadenopathy:    She has no cervical adenopathy.  Neurological: She displays normal reflexes. No cranial nerve deficit. She exhibits normal muscle tone. Coordination normal.  Skin: No rash noted. No erythema.  Psychiatric: She has a normal mood and affect. Her behavior is normal. Judgment and thought content normal.    Lab Results  Component Value Date   WBC 6.8 05/21/2012   HGB 10.6* 05/21/2012   HCT 32.7* 05/21/2012   PLT 173 05/21/2012   GLUCOSE 94 05/21/2012   CHOL 206* 04/12/2012   TRIG 54.0 04/12/2012   HDL 85.10 04/12/2012   LDLDIRECT 106.8 04/12/2012   LDLCALC 105* 12/03/2007   ALT 23 05/13/2012   AST 25 05/13/2012   NA 138 05/21/2012   K 3.5 05/21/2012   CL 102 05/21/2012   CREATININE 0.76 05/21/2012   BUN 9 05/21/2012   CO2 29 05/21/2012   TSH 0.86 04/12/2012   INR 0.91 05/13/2012         Assessment & Plan:

## 2014-06-23 NOTE — Assessment & Plan Note (Signed)
We discussed age appropriate health related issues, including available/recomended screening tests and vaccinations. We discussed a need for adhering to healthy diet and exercise. Labs/EKG were reviewed/ordered. All questions were answered. Flu shot 

## 2014-06-23 NOTE — Progress Notes (Deleted)
Pre visit review using our clinic review tool, if applicable. No additional management support is needed unless otherwise documented below in the visit note. 

## 2014-06-23 NOTE — Assessment & Plan Note (Signed)
Chronic  06/2014 dysphagia: x1 year Dr Deatra Ina Start Nexium po

## 2014-06-26 ENCOUNTER — Other Ambulatory Visit (INDEPENDENT_AMBULATORY_CARE_PROVIDER_SITE_OTHER): Payer: 59

## 2014-06-26 DIAGNOSIS — Z0000 Encounter for general adult medical examination without abnormal findings: Secondary | ICD-10-CM

## 2014-06-26 DIAGNOSIS — D649 Anemia, unspecified: Secondary | ICD-10-CM

## 2014-06-26 LAB — URINALYSIS
Bilirubin Urine: NEGATIVE
HGB URINE DIPSTICK: NEGATIVE
Ketones, ur: NEGATIVE
LEUKOCYTES UA: NEGATIVE
Nitrite: NEGATIVE
Total Protein, Urine: NEGATIVE
UROBILINOGEN UA: 0.2 (ref 0.0–1.0)
Urine Glucose: NEGATIVE
pH: 6 (ref 5.0–8.0)

## 2014-06-26 LAB — VITAMIN B12: VITAMIN B 12: 725 pg/mL (ref 211–911)

## 2014-06-26 LAB — LIPID PANEL
Cholesterol: 218 mg/dL — ABNORMAL HIGH (ref 0–200)
HDL: 75.8 mg/dL (ref >39.00)
LDL CALC: 130 mg/dL — AB (ref 0–99)
NonHDL: 142.2
TRIGLYCERIDES: 61 mg/dL (ref 0.0–149.0)
Total CHOL/HDL Ratio: 3
VLDL: 12.2 mg/dL (ref 0.0–40.0)

## 2014-06-26 LAB — CBC WITH DIFFERENTIAL/PLATELET
BASOS PCT: 0.5 % (ref 0.0–3.0)
Basophils Absolute: 0 10*3/uL (ref 0.0–0.1)
Eosinophils Absolute: 0.1 10*3/uL (ref 0.0–0.7)
Eosinophils Relative: 1.7 % (ref 0.0–5.0)
HCT: 40.8 % (ref 36.0–46.0)
Hemoglobin: 13.9 g/dL (ref 12.0–15.0)
LYMPHS PCT: 30 % (ref 12.0–46.0)
Lymphs Abs: 1.2 10*3/uL (ref 0.7–4.0)
MCHC: 34.1 g/dL (ref 30.0–36.0)
MCV: 94.4 fl (ref 78.0–100.0)
Monocytes Absolute: 0.4 10*3/uL (ref 0.1–1.0)
Monocytes Relative: 9.7 % (ref 3.0–12.0)
Neutro Abs: 2.3 10*3/uL (ref 1.4–7.7)
Neutrophils Relative %: 58.1 % (ref 43.0–77.0)
PLATELETS: 259 10*3/uL (ref 150.0–400.0)
RBC: 4.32 Mil/uL (ref 3.87–5.11)
RDW: 13.3 % (ref 11.5–15.5)
WBC: 3.9 10*3/uL — AB (ref 4.0–10.5)

## 2014-06-26 LAB — IBC PANEL
Iron: 60 ug/dL (ref 42–145)
SATURATION RATIOS: 12.4 % — AB (ref 20.0–50.0)
Transferrin: 344.6 mg/dL (ref 212.0–360.0)

## 2014-06-26 LAB — HEPATIC FUNCTION PANEL
ALT: 27 U/L (ref 0–35)
AST: 32 U/L (ref 0–37)
Albumin: 4.3 g/dL (ref 3.5–5.2)
Alkaline Phosphatase: 80 U/L (ref 39–117)
Bilirubin, Direct: 0 mg/dL (ref 0.0–0.3)
Total Bilirubin: 0.6 mg/dL (ref 0.2–1.2)
Total Protein: 7.5 g/dL (ref 6.0–8.3)

## 2014-06-26 LAB — BASIC METABOLIC PANEL
BUN: 17 mg/dL (ref 6–23)
CALCIUM: 9.5 mg/dL (ref 8.4–10.5)
CO2: 30 mEq/L (ref 19–32)
Chloride: 101 mEq/L (ref 96–112)
Creatinine, Ser: 0.8 mg/dL (ref 0.4–1.2)
GFR: 73.35 mL/min (ref >60.00)
Glucose, Bld: 95 mg/dL (ref 70–99)
POTASSIUM: 4 meq/L (ref 3.5–5.1)
SODIUM: 138 meq/L (ref 135–145)

## 2014-06-26 LAB — TSH: TSH: 0.83 u[IU]/mL (ref 0.35–4.50)

## 2014-07-22 ENCOUNTER — Encounter: Payer: Self-pay | Admitting: Internal Medicine

## 2014-07-23 ENCOUNTER — Other Ambulatory Visit: Payer: Self-pay | Admitting: Gastroenterology

## 2014-08-06 ENCOUNTER — Other Ambulatory Visit: Payer: Self-pay | Admitting: Internal Medicine

## 2014-09-01 ENCOUNTER — Other Ambulatory Visit: Payer: Self-pay | Admitting: Internal Medicine

## 2014-10-17 ENCOUNTER — Other Ambulatory Visit: Payer: Self-pay | Admitting: Gastroenterology

## 2014-10-28 ENCOUNTER — Other Ambulatory Visit: Payer: Self-pay | Admitting: Gastroenterology

## 2014-11-24 ENCOUNTER — Other Ambulatory Visit: Payer: Self-pay | Admitting: Internal Medicine

## 2014-11-24 ENCOUNTER — Ambulatory Visit (INDEPENDENT_AMBULATORY_CARE_PROVIDER_SITE_OTHER): Payer: 59 | Admitting: Internal Medicine

## 2014-11-24 ENCOUNTER — Encounter: Payer: Self-pay | Admitting: Internal Medicine

## 2014-11-24 VITALS — BP 138/80 | HR 71 | Temp 97.8°F | Resp 16 | Wt 150.0 lb

## 2014-11-24 DIAGNOSIS — J0121 Acute recurrent ethmoidal sinusitis: Secondary | ICD-10-CM

## 2014-11-24 MED ORDER — ACYCLOVIR 400 MG PO TABS: ORAL_TABLET | ORAL | Status: DC

## 2014-11-24 MED ORDER — AMOXICILLIN 875 MG PO TABS: 875.0000 mg | ORAL_TABLET | Freq: Two times a day (BID) | ORAL | Status: DC

## 2014-11-24 NOTE — Progress Notes (Signed)
   Subjective:    Patient ID: Natalie Fletcher, female    DOB: [birth year], 64 y.o.   MRN: [id number]  Sinusitis This is a new problem. The current episode started 1 to 4 weeks ago. The problem has been gradually worsening since onset. There has been no fever. The fever has been present for less than 1 day. Her pain is at a severity of 0/10. She is experiencing no pain. Associated symptoms include chills, congestion and sinus pressure. Pertinent negatives include no coughing, diaphoresis, ear pain, headaches, hoarse voice, neck pain, shortness of breath, sneezing, sore throat or swollen glands. Past treatments include oral decongestants and saline sprays. The treatment provided mild relief.      Review of Systems  Constitutional: Positive for chills. Negative for fever, diaphoresis and fatigue.  HENT: Positive for congestion and sinus pressure. Negative for ear pain, hoarse voice, postnasal drip, rhinorrhea, sneezing, sore throat, tinnitus and trouble swallowing.   Eyes: Negative.   Respiratory: Negative.  Negative for cough, choking, chest tightness and shortness of breath.   Cardiovascular: Negative.  Negative for chest pain, palpitations and leg swelling.  Gastrointestinal: Negative.  Negative for nausea, vomiting, abdominal pain, diarrhea, constipation and blood in stool.  Endocrine: Negative.   Genitourinary: Negative.   Musculoskeletal: Negative.  Negative for back pain, arthralgias and neck pain.  Skin: Negative.  Negative for rash.  Allergic/Immunologic: Negative.   Neurological: Negative.  Negative for headaches.  Hematological: Negative.  Negative for adenopathy. Does not bruise/bleed easily.  Psychiatric/Behavioral: Negative.        Objective:   Physical Exam  Constitutional: She is oriented to person, place, and time. She appears well-developed and well-nourished.  Non-toxic appearance. She does not have a sickly appearance. She does not appear ill. No distress.  HENT:    Head: Normocephalic and atraumatic.  Right Ear: Hearing, tympanic membrane, external ear and ear canal normal.  Left Ear: Hearing, tympanic membrane, external ear and ear canal normal.  Nose: Mucosal edema and rhinorrhea present. Right sinus exhibits maxillary sinus tenderness. Right sinus exhibits no frontal sinus tenderness. Left sinus exhibits no maxillary sinus tenderness and no frontal sinus tenderness.  Mouth/Throat: Oropharynx is clear and moist. No oropharyngeal exudate.  Eyes: Conjunctivae are normal. Right eye exhibits no discharge. Left eye exhibits no discharge. No scleral icterus.  Neck: Normal range of motion. Neck supple. No JVD present. No tracheal deviation present. No thyromegaly present.  Cardiovascular: Normal rate, regular rhythm, normal heart sounds and intact distal pulses.  Exam reveals no gallop and no friction rub.   No murmur heard. Pulmonary/Chest: Effort normal and breath sounds normal. No stridor. No respiratory distress. She has no wheezes. She has no rales. She exhibits no tenderness.  Abdominal: Soft. Bowel sounds are normal. She exhibits no distension and no mass. There is no tenderness. There is no rebound and no guarding.  Musculoskeletal: Normal range of motion. She exhibits no edema or tenderness.  Lymphadenopathy:    She has no cervical adenopathy.  Neurological: She is oriented to person, place, and time.  Skin: Skin is warm and dry. No rash noted. She is not diaphoretic. No erythema. No pallor.  Vitals reviewed.         Assessment & Plan:

## 2014-11-24 NOTE — Progress Notes (Signed)
Pre visit review using our clinic review tool, if applicable. No additional management support is needed unless otherwise documented below in the visit note. 

## 2014-11-24 NOTE — Patient Instructions (Signed)

## 2014-11-25 ENCOUNTER — Encounter: Payer: Self-pay | Admitting: Internal Medicine

## 2014-11-25 NOTE — Assessment & Plan Note (Signed)
She has maximized her OTC and symptom relief for this Will treat for bacterial infection with amoxil

## 2014-12-02 ENCOUNTER — Other Ambulatory Visit: Payer: Self-pay | Admitting: Internal Medicine

## 2014-12-31 ENCOUNTER — Ambulatory Visit (INDEPENDENT_AMBULATORY_CARE_PROVIDER_SITE_OTHER): Payer: 59 | Admitting: Family

## 2014-12-31 ENCOUNTER — Encounter: Payer: Self-pay | Admitting: Family

## 2014-12-31 VITALS — BP 144/86 | HR 89 | Temp 99.1°F | Resp 18 | Ht 62.0 in | Wt 146.8 lb

## 2014-12-31 DIAGNOSIS — J0121 Acute recurrent ethmoidal sinusitis: Secondary | ICD-10-CM

## 2014-12-31 MED ORDER — PROMETHAZINE-DM 6.25-15 MG/5ML PO SYRP: 5.0000 mL | ORAL_SOLUTION | Freq: Four times a day (QID) | ORAL | Status: DC | PRN

## 2014-12-31 MED ORDER — LEVOFLOXACIN 500 MG PO TABS: 500.0000 mg | ORAL_TABLET | Freq: Every day | ORAL | Status: DC

## 2014-12-31 NOTE — Assessment & Plan Note (Signed)
Symptoms and exam consistent with acute refractory sinusitis. Start Levaquin. Start Promethazine DM as needed for sleep and cough. Continue over-the-counter medications as needed for symptom relief and supportive care. Follow-up if symptoms worsen or fail to improve.

## 2014-12-31 NOTE — Patient Instructions (Signed)
Thank you for choosing [facility].  Summary/Instructions:  Your prescription(s) have been submitted to your pharmacy or been printed and provided for you. Please take as directed and contact our office if you believe you are having problem(s) with the medication(s) or have any questions.  If your symptoms worsen or fail to improve, please contact our office for further instruction, or in case of emergency go directly to the emergency room at the closest medical facility.   General Recommendations:    Please drink plenty of fluids.  Get plenty of rest   Sleep in humidified air  Use saline nasal sprays  Netti pot   OTC Medications:  Decongestants - helps relieve congestion   Flonase (generic fluticasone) or Nasacort (generic triamcinolone) - please make sure to use the "cross-over" technique at a 45 degree angle towards the opposite eye as opposed to straight up the nasal passageway.   Sudafed (generic pseudoephedrine - Note this is the one that is available behind the pharmacy counter); Products with phenylephrine (-PE) may also be used but is often not as effective as pseudoephedrine.   If you have HIGH BLOOD PRESSURE - Coricidin HBP; AVOID any product that is -D as this contains pseudoephedrine which may increase your blood pressure.  Afrin (oxymetazoline) every 6-8 hours for up to 3 days.   Allergies - helps relieve runny nose, itchy eyes and sneezing   Claritin (generic loratidine), Allegra (fexofenidine), or Zyrtec (generic cyrterizine) for runny nose. These medications should not cause drowsiness.  Note - Benadryl (generic diphenhydramine) may be used however may cause drowsiness  Cough -   Delsym or Robitussin (generic dextromethorphan)  Expectorants - helps loosen mucus to ease removal   Mucinex (generic guaifenesin) as directed on the package.  Headaches / General Aches   Tylenol (generic acetaminophen) - DO NOT EXCEED 3 grams (3,000 mg) in a 24  hour time period  Advil/Motrin (generic ibuprofen)   Sore Throat -   Salt water gargle   Chloraseptic (generic benzocaine) spray or lozenges / Sucrets (generic dyclonine)      

## 2014-12-31 NOTE — Progress Notes (Signed)
Pre visit review using our clinic review tool, if applicable. No additional management support is needed unless otherwise documented below in the visit note. 

## 2014-12-31 NOTE — Progress Notes (Signed)
   Subjective:    Patient ID: Natalie Fletcher, female    DOB: 11/22/1950, 64 y.o.   MRN: [id number]  Chief Complaint  Patient presents with  . Cough    x1 month, headache, fever, chills, body aches, and cough    HPI:  Natalie Fletcher is a 64 y.o. female who presents today for an acute visit.   Associated symptoms of headache, fever, chills, body ache and cough have been going on for about 1 month. She was recently seen in the beginning of February for a sinus infection and treated with amoxicillin with minimal improvements. This was the second round of amoxicillin. Has tried theraflu which has helped minimally as well.    Allergies  Allergen Reactions  . Codeine Nausea And Vomiting    Current Outpatient Prescriptions on File Prior to Visit  Medication Sig Dispense Refill  . acyclovir (ZOVIRAX) 400 MG tablet TAKE 1 TABLET (400 MG TOTAL) BY MOUTH 3 (THREE) TIMES DAILY. 21 tablet 1  . Calcium Carb-Cholecalciferol (CALCIUM 600 + D PO) Take 1 each by mouth daily.    . Multiple Vitamins-Minerals (MATURE ADULT CENTURY PO) Take by mouth daily.    . pantoprazole (PROTONIX) 40 MG tablet TAKE 1 TABLET BY MOUTH EVERY DAY 30 MINUTES BEFORE A MEAL 90 tablet 0   No current facility-administered medications on file prior to visit.     Review of Systems  Constitutional: Positive for chills. Negative for fever.  HENT: Positive for congestion, ear pain, sinus pressure and sore throat.   Respiratory: Positive for cough and chest tightness. Negative for shortness of breath.   Neurological: Positive for headaches.      Objective:    BP 144/86 mmHg  Pulse 89  Temp(Src) 99.1 F (37.3 C) (Oral)  Resp 18  Ht 5\' 2"  (1.575 m)  Wt 146 lb 12.8 oz (66.588 kg)  BMI 26.84 kg/m2  SpO2 98% Nursing note and vital signs reviewed.  Physical Exam  Constitutional: She is oriented to person, place, and time. She appears well-developed and well-nourished. No distress.  HENT:  Right Ear: Hearing,  tympanic membrane, external ear and ear canal normal.  Left Ear: Hearing, tympanic membrane, external ear and ear canal normal.  Nose: Right sinus exhibits maxillary sinus tenderness and frontal sinus tenderness. Left sinus exhibits maxillary sinus tenderness and frontal sinus tenderness.  Mouth/Throat: Uvula is midline, oropharynx is clear and moist and mucous membranes are normal.  Cardiovascular: Normal rate, regular rhythm, normal heart sounds and intact distal pulses.   Pulmonary/Chest: Effort normal and breath sounds normal.  Neurological: She is alert and oriented to person, place, and time.  Skin: Skin is warm and dry.  Psychiatric: She has a normal mood and affect. Her behavior is normal. Judgment and thought content normal.       Assessment & Plan:

## 2015-01-02 ENCOUNTER — Other Ambulatory Visit: Payer: Self-pay | Admitting: Family

## 2015-01-23 ENCOUNTER — Other Ambulatory Visit: Payer: Self-pay | Admitting: Gastroenterology

## 2015-01-26 ENCOUNTER — Other Ambulatory Visit: Payer: Self-pay | Admitting: Gastroenterology

## 2015-02-03 ENCOUNTER — Other Ambulatory Visit: Payer: Self-pay | Admitting: Gastroenterology

## 2015-04-21 ENCOUNTER — Other Ambulatory Visit: Payer: Self-pay | Admitting: Internal Medicine

## 2015-05-01 ENCOUNTER — Other Ambulatory Visit: Payer: Self-pay | Admitting: Gastroenterology

## 2015-05-04 ENCOUNTER — Other Ambulatory Visit: Payer: Self-pay | Admitting: Gastroenterology

## 2015-05-12 ENCOUNTER — Other Ambulatory Visit: Payer: Self-pay | Admitting: Gastroenterology

## 2015-05-29 ENCOUNTER — Other Ambulatory Visit: Payer: Self-pay | Admitting: Gastroenterology

## 2015-07-10 ENCOUNTER — Ambulatory Visit (INDEPENDENT_AMBULATORY_CARE_PROVIDER_SITE_OTHER): Payer: 59 | Admitting: *Deleted

## 2015-07-10 DIAGNOSIS — Z23 Encounter for immunization: Secondary | ICD-10-CM | POA: Diagnosis not present

## 2015-08-27 ENCOUNTER — Other Ambulatory Visit: Payer: Self-pay | Admitting: Gastroenterology

## 2015-10-13 ENCOUNTER — Other Ambulatory Visit: Payer: Self-pay | Admitting: Internal Medicine

## 2015-11-03 ENCOUNTER — Other Ambulatory Visit: Payer: Self-pay | Admitting: Internal Medicine

## 2015-11-03 ENCOUNTER — Telehealth: Payer: Self-pay | Admitting: Internal Medicine

## 2015-11-03 NOTE — Telephone Encounter (Signed)
Pt called in and need refill on acyclovir (ZOVIRAX) 400 MG tablet [id number]

## 2015-11-04 MED ORDER — ACYCLOVIR 400 MG PO TABS: ORAL_TABLET | ORAL | Status: DC

## 2015-11-04 NOTE — Telephone Encounter (Signed)
Notified pt refills sent to [facility].../lmb

## 2015-11-12 ENCOUNTER — Ambulatory Visit (INDEPENDENT_AMBULATORY_CARE_PROVIDER_SITE_OTHER): Payer: 59 | Admitting: Internal Medicine

## 2015-11-12 ENCOUNTER — Encounter: Payer: Self-pay | Admitting: Internal Medicine

## 2015-11-12 VITALS — BP 130/78 | HR 84 | Temp 98.2°F | Wt 165.0 lb

## 2015-11-12 DIAGNOSIS — B001 Herpesviral vesicular dermatitis: Secondary | ICD-10-CM

## 2015-11-12 MED ORDER — BENZONATATE 200 MG PO CAPS: 200.0000 mg | ORAL_CAPSULE | Freq: Two times a day (BID) | ORAL | Status: DC | PRN

## 2015-11-12 MED ORDER — ACYCLOVIR 400 MG PO TABS: ORAL_TABLET | ORAL | Status: DC

## 2015-11-12 MED ORDER — AZITHROMYCIN 250 MG PO TABS: ORAL_TABLET | ORAL | Status: DC

## 2015-11-12 NOTE — Progress Notes (Signed)
Pre visit review using our clinic review tool, if applicable. No additional management support is needed unless otherwise documented below in the visit note. 

## 2015-11-12 NOTE — Progress Notes (Signed)
Subjective:  Patient ID: Natalie Fletcher, female    DOB: 10-09-1951  Age: 65 y.o. MRN: [id number]  CC: No chief complaint on file.   HPI ALIZ MUSGRAVE presents for sinusitis sx's x 1 week  Outpatient Prescriptions Prior to Visit  Medication Sig Dispense Refill  . buPROPion (WELLBUTRIN SR) 150 MG 12 hr tablet TAKE 1 TABLET (150 MG TOTAL) BY MOUTH 2 (TWO) TIMES DAILY. 180 tablet 2  . Calcium Carb-Cholecalciferol (CALCIUM 600 + D PO) Take 1 each by mouth daily.    . Multiple Vitamins-Minerals (MATURE ADULT CENTURY PO) Take by mouth daily.    . pantoprazole (PROTONIX) 40 MG tablet TAKE 1 TABLET BY MOUTH EVERY DAY 30 MINUTES BEFORE A MEAL 90 tablet 0  . acyclovir (ZOVIRAX) 400 MG tablet TAKE 1 TABLET (400 MG TOTAL) BY MOUTH 3 (THREE) TIMES DAILY. Yearly physical is due must see md for future refills 21 tablet 0  . pantoprazole (PROTONIX) 40 MG tablet TAKE 1 TABLET BY MOUTH EVERY DAY 30 MINUTES BEFORE A MEAL 90 tablet 0  . pantoprazole (PROTONIX) 40 MG tablet TAKE 1 TABLET BY MOUTH EVERY DAY 30 MINUTES BEFORE A MEAL 90 tablet 0  . promethazine-dextromethorphan (PROMETHAZINE-DM) 6.25-15 MG/5ML syrup TAKE 5 MLS BY MOUTH 4 (FOUR) TIMES DAILY AS NEEDED. 118 mL 0  . levofloxacin (LEVAQUIN) 500 MG tablet Take 1 tablet (500 mg total) by mouth daily. (Patient not taking: Reported on 11/12/2015) 7 tablet 0   No facility-administered medications prior to visit.    ROS Review of Systems  Constitutional: Negative for chills, activity change, appetite change, fatigue and unexpected weight change.  HENT: Negative for congestion, mouth sores and sinus pressure.   Eyes: Negative for visual disturbance.  Respiratory: Negative for cough and chest tightness.   Gastrointestinal: Negative for nausea and abdominal pain.  Genitourinary: Negative for frequency, difficulty urinating and vaginal pain.  Musculoskeletal: Negative for back pain and gait problem.  Skin: Negative for pallor and rash.    Neurological: Negative for dizziness, tremors, weakness, numbness and headaches.  Psychiatric/Behavioral: Negative for suicidal ideas, confusion and sleep disturbance.    Objective:  BP 130/78 mmHg  Pulse 84  Temp(Src) 98.2 F (36.8 C) (Oral)  Wt 165 lb (74.844 kg)  SpO2 95%  BP Readings from Last 3 Encounters:  11/12/15 130/78  12/31/14 144/86  11/24/14 138/80    Wt Readings from Last 3 Encounters:  11/12/15 165 lb (74.844 kg)  12/31/14 146 lb 12.8 oz (66.588 kg)  11/24/14 150 lb (68.04 kg)    Physical Exam  Constitutional: She appears well-developed. No distress.  HENT:  Head: Normocephalic.  Right Ear: External ear normal.  Left Ear: External ear normal.  Nose: Nose normal.  Mouth/Throat: Oropharynx is clear and moist.  Eyes: Conjunctivae are normal. Pupils are equal, round, and reactive to light. Right eye exhibits no discharge. Left eye exhibits no discharge.  Neck: Normal range of motion. Neck supple. No JVD present. No tracheal deviation present. No thyromegaly present.  Cardiovascular: Normal rate, regular rhythm and normal heart sounds.   No murmur heard. Pulmonary/Chest: No stridor. No respiratory distress. She has no wheezes.  Abdominal: Soft. Bowel sounds are normal. She exhibits no distension and no mass. There is no tenderness. There is no rebound and no guarding.  Musculoskeletal: She exhibits no edema or tenderness.  Lymphadenopathy:    She has no cervical adenopathy.  Neurological: She displays normal reflexes. No cranial nerve deficit. She exhibits normal muscle tone. Coordination normal.  Skin: No rash noted. No erythema.  Psychiatric: She has a normal mood and affect. Her behavior is normal. Judgment and thought content normal.  eryth throat  Lab Results  Component Value Date   WBC 3.9* 06/26/2014   HGB 13.9 06/26/2014   HCT 40.8 06/26/2014   PLT 259.0 06/26/2014   GLUCOSE 95 06/26/2014   CHOL 218* 06/26/2014   TRIG 61.0 06/26/2014   HDL  75.80 06/26/2014   LDLDIRECT 106.8 04/12/2012   LDLCALC 130* 06/26/2014   ALT 27 06/26/2014   AST 32 06/26/2014   NA 138 06/26/2014   K 4.0 06/26/2014   CL 101 06/26/2014   CREATININE 0.8 06/26/2014   BUN 17 06/26/2014   CO2 30 06/26/2014   TSH 0.83 06/26/2014   INR 0.91 05/13/2012    Dg Esophagus  02/03/2013  *RADIOLOGY REPORT* Clinical Data:Difficulty swallowing with history of esophageal dilatation. ESOPHAGUS/BARIUM SWALLOW/TABLET STUDY Fluoroscopy Time: 37 seconds. Comparison: None. Findings: Swallowing mechanism is unremarkable.  Esophageal motility is normal.  No esophageal fold thickening, stricture or obstruction.  A 13 mm barium pill passed into the stomach without difficulty. IMPRESSION: Normal study. Original Report Authenticated By: Lorin Picket, M.D.    Assessment & Plan:   Diagnoses and all orders for this visit:  Herpes simplex labialis  Other orders -     azithromycin (ZITHROMAX Z-PAK) 250 MG tablet; As directed -     benzonatate (TESSALON) 200 MG capsule; Take 1 capsule (200 mg total) by mouth 2 (two) times daily as needed for cough. -     acyclovir (ZOVIRAX) 400 MG tablet; TAKE 1 TABLET (400 MG TOTAL) BY MOUTH 3 (THREE) TIMES DAILY. Yearly physical is due must see md for future refills  I have discontinued Ms. Scheurich's levofloxacin and promethazine-dextromethorphan. I am also having her start on azithromycin and benzonatate. Additionally, I am having her maintain her Calcium Carb-Cholecalciferol (CALCIUM 600 + D PO), Multiple Vitamins-Minerals (MATURE ADULT CENTURY PO), pantoprazole, buPROPion, and acyclovir.  Meds ordered this encounter  Medications  . azithromycin (ZITHROMAX Z-PAK) 250 MG tablet    Sig: As directed    Dispense:  6 each    Refill:  0  . benzonatate (TESSALON) 200 MG capsule    Sig: Take 1 capsule (200 mg total) by mouth 2 (two) times daily as needed for cough.    Dispense:  35 capsule    Refill:  1  . acyclovir (ZOVIRAX) 400 MG tablet     Sig: TAKE 1 TABLET (400 MG TOTAL) BY MOUTH 3 (THREE) TIMES DAILY. Yearly physical is due must see md for future refills    Dispense:  21 tablet    Refill:  3     Follow-up: Return in about 6 months (around 05/11/2016) for Wellness Exam.  Walker Kehr, MD

## 2015-11-12 NOTE — Assessment & Plan Note (Signed)
Acyclovir prn rx

## 2015-11-12 NOTE — Patient Instructions (Signed)
Use over-the-counter  "cold" medicines  such as "Tylenol cold" , "Advil cold",  "Mucinex" or" Mucinex D"  for cough and congestion.   Avoid decongestants if you have high blood pressure and use "Afrin" nasal spray for nasal congestion as directed instead. Use" Delsym" or" Robitussin" cough syrup varietis for cough.  You can use plain "Tylenol" or "Advil" for fever, chills and achyness. Use Halls or Ricola cough drops.  Please, make an appointment if you are not better or if you're worse.  

## 2015-12-02 ENCOUNTER — Other Ambulatory Visit: Payer: Self-pay | Admitting: Gastroenterology

## 2015-12-12 ENCOUNTER — Other Ambulatory Visit: Payer: Self-pay | Admitting: Internal Medicine

## 2015-12-12 ENCOUNTER — Other Ambulatory Visit: Payer: Self-pay | Admitting: Gastroenterology

## 2015-12-14 ENCOUNTER — Telehealth: Payer: Self-pay | Admitting: Internal Medicine

## 2015-12-14 ENCOUNTER — Ambulatory Visit (INDEPENDENT_AMBULATORY_CARE_PROVIDER_SITE_OTHER): Payer: 59 | Admitting: General Practice

## 2015-12-14 DIAGNOSIS — Z418 Encounter for other procedures for purposes other than remedying health state: Secondary | ICD-10-CM | POA: Diagnosis not present

## 2015-12-14 DIAGNOSIS — Z299 Encounter for prophylactic measures, unspecified: Secondary | ICD-10-CM

## 2015-12-14 NOTE — Telephone Encounter (Signed)
Ok - pls print Thx

## 2015-12-14 NOTE — Telephone Encounter (Signed)
i checked with lab, tb results (even if ordered as stat) will not result by 8am on 3/2 per patient request, i have advised patient we can print letter stating she was seen at [facility] for the tb test to be given and then she can get another letter with results after she results (48 hours later), patient advised it must be 48 hours before tb can be read---patient has been placed on nurse visit today at 11am, patient to return on 3/2 at 11am to get tb read and results letter printed

## 2015-12-14 NOTE — Telephone Encounter (Signed)
Pt has to get the TB test for work and she has to turn it in on 12/16/15 Thursday. Pt was wondering if Dr. Camila Li can order a blood work to test for this or what can we do for her? Please advise. Pt put on schedule today at 11 am for TB test.   # [phone]

## 2015-12-16 LAB — TB SKIN TEST
Induration: 0 mm
TB Skin Test: NEGATIVE

## 2016-01-06 ENCOUNTER — Other Ambulatory Visit (INDEPENDENT_AMBULATORY_CARE_PROVIDER_SITE_OTHER): Payer: 59

## 2016-01-06 ENCOUNTER — Encounter: Payer: Self-pay | Admitting: Internal Medicine

## 2016-01-06 ENCOUNTER — Ambulatory Visit (INDEPENDENT_AMBULATORY_CARE_PROVIDER_SITE_OTHER): Payer: 59 | Admitting: Internal Medicine

## 2016-01-06 VITALS — BP 132/84 | HR 75 | Temp 97.7°F | Ht 62.0 in | Wt 164.0 lb

## 2016-01-06 DIAGNOSIS — F32A Depression, unspecified: Secondary | ICD-10-CM

## 2016-01-06 DIAGNOSIS — Z0000 Encounter for general adult medical examination without abnormal findings: Secondary | ICD-10-CM | POA: Diagnosis not present

## 2016-01-06 DIAGNOSIS — M5442 Lumbago with sciatica, left side: Secondary | ICD-10-CM | POA: Diagnosis not present

## 2016-01-06 DIAGNOSIS — G4700 Insomnia, unspecified: Secondary | ICD-10-CM

## 2016-01-06 DIAGNOSIS — F329 Major depressive disorder, single episode, unspecified: Secondary | ICD-10-CM | POA: Diagnosis not present

## 2016-01-06 DIAGNOSIS — K219 Gastro-esophageal reflux disease without esophagitis: Secondary | ICD-10-CM | POA: Diagnosis not present

## 2016-01-06 DIAGNOSIS — M5441 Lumbago with sciatica, right side: Secondary | ICD-10-CM

## 2016-01-06 LAB — URINALYSIS
BILIRUBIN URINE: NEGATIVE
HGB URINE DIPSTICK: NEGATIVE
Ketones, ur: NEGATIVE
Leukocytes, UA: NEGATIVE
Nitrite: NEGATIVE
Specific Gravity, Urine: <=1.005 — AB (ref 1.000–1.030)
TOTAL PROTEIN, URINE-UPE24: NEGATIVE
URINE GLUCOSE: NEGATIVE
Urobilinogen, UA: 0.2 (ref 0.0–1.0)
pH: 7 (ref 5.0–8.0)

## 2016-01-06 LAB — LIPID PANEL
CHOL/HDL RATIO: 3
Cholesterol: 244 mg/dL — ABNORMAL HIGH (ref 0–200)
HDL: 92.3 mg/dL (ref >39.00)
LDL Cholesterol: 137 mg/dL — ABNORMAL HIGH (ref 0–99)
NONHDL: 151.2
Triglycerides: 71 mg/dL (ref 0.0–149.0)
VLDL: 14.2 mg/dL (ref 0.0–40.0)

## 2016-01-06 LAB — CBC WITH DIFFERENTIAL/PLATELET
Basophils Absolute: 0 10*3/uL (ref 0.0–0.1)
Basophils Relative: 0.7 % (ref 0.0–3.0)
EOS PCT: 1 % (ref 0.0–5.0)
Eosinophils Absolute: 0.1 10*3/uL (ref 0.0–0.7)
HCT: 41.1 % (ref 36.0–46.0)
Hemoglobin: 13.9 g/dL (ref 12.0–15.0)
LYMPHS ABS: 1.5 10*3/uL (ref 0.7–4.0)
Lymphocytes Relative: 24.2 % (ref 12.0–46.0)
MCHC: 33.7 g/dL (ref 30.0–36.0)
MCV: 94.1 fl (ref 78.0–100.0)
MONOS PCT: 7.5 % (ref 3.0–12.0)
Monocytes Absolute: 0.5 10*3/uL (ref 0.1–1.0)
NEUTROS ABS: 4.2 10*3/uL (ref 1.4–7.7)
NEUTROS PCT: 66.6 % (ref 43.0–77.0)
PLATELETS: 358 10*3/uL (ref 150.0–400.0)
RBC: 4.36 Mil/uL (ref 3.87–5.11)
RDW: 13.5 % (ref 11.5–15.5)
WBC: 6.3 10*3/uL (ref 4.0–10.5)

## 2016-01-06 LAB — HEPATIC FUNCTION PANEL
ALBUMIN: 4.6 g/dL (ref 3.5–5.2)
ALT: 17 U/L (ref 0–35)
AST: 21 U/L (ref 0–37)
Alkaline Phosphatase: 66 U/L (ref 39–117)
Bilirubin, Direct: 0.1 mg/dL (ref 0.0–0.3)
Total Bilirubin: 0.4 mg/dL (ref 0.2–1.2)
Total Protein: 7.8 g/dL (ref 6.0–8.3)

## 2016-01-06 LAB — BASIC METABOLIC PANEL
BUN: 11 mg/dL (ref 6–23)
CHLORIDE: 102 meq/L (ref 96–112)
CO2: 32 meq/L (ref 19–32)
Calcium: 10.4 mg/dL (ref 8.4–10.5)
Creatinine, Ser: 0.86 mg/dL (ref 0.40–1.20)
GFR: 70.08 mL/min (ref >60.00)
GLUCOSE: 100 mg/dL — AB (ref 70–99)
POTASSIUM: 4.6 meq/L (ref 3.5–5.1)
SODIUM: 142 meq/L (ref 135–145)

## 2016-01-06 MED ORDER — ZALEPLON 5 MG PO CAPS: 5.0000 mg | ORAL_CAPSULE | Freq: Every evening | ORAL | Status: DC | PRN

## 2016-01-06 MED ORDER — PANTOPRAZOLE SODIUM 40 MG PO TBEC: DELAYED_RELEASE_TABLET | ORAL | Status: DC

## 2016-01-06 MED ORDER — ACYCLOVIR 400 MG PO TABS: ORAL_TABLET | ORAL | Status: DC

## 2016-01-06 MED ORDER — BUPROPION HCL ER (SR) 150 MG PO TB12: 150.0000 mg | ORAL_TABLET | Freq: Two times a day (BID) | ORAL | Status: DC

## 2016-01-06 MED ORDER — AZITHROMYCIN 250 MG PO TABS: ORAL_TABLET | ORAL | Status: DC

## 2016-01-06 NOTE — Assessment & Plan Note (Signed)
Chronic OA 

## 2016-01-06 NOTE — Assessment & Plan Note (Signed)
Zaleplon prn   Potential benefits of a long term benzodiazepines  use as well as potential risks  and complications were explained to the patient and were aknowledged. 

## 2016-01-06 NOTE — Progress Notes (Signed)
Pre visit review using our clinic review tool, if applicable. No additional management support is needed unless otherwise documented below in the visit note. 

## 2016-01-06 NOTE — Assessment & Plan Note (Addendum)
We discussed age appropriate health related issues, including available/recomended screening tests and vaccinations. We discussed a need for adhering to healthy diet and exercise. Labs/EKG were reviewed/ordered. All questions were answered.  Colon due 2019 

## 2016-01-06 NOTE — Assessment & Plan Note (Signed)
On Protonix 

## 2016-01-06 NOTE — Assessment & Plan Note (Signed)
Chronic Wellbutrin po

## 2016-01-06 NOTE — Progress Notes (Signed)
Subjective:  Patient ID: Natalie Fletcher, female    DOB: 06-15-1951  Age: 65 y.o. MRN: [id number]  CC: Annual Exam and Medication Refill   HPI Natalie Fletcher presents for a well exam. C/o insomnia C/o URI sx's,occ H simplex/labialis  Outpatient Prescriptions Prior to Visit  Medication Sig Dispense Refill  . Calcium Carb-Cholecalciferol (CALCIUM 600 + D PO) Take 1 each by mouth daily.    . Multiple Vitamins-Minerals (MATURE ADULT CENTURY PO) Take by mouth daily.    . pantoprazole (PROTONIX) 40 MG tablet TAKE 1 TABLET BY MOUTH EVERY DAY 30 MINUTES BEFORE A MEAL 90 tablet 0  . acyclovir (ZOVIRAX) 400 MG tablet TAKE 1 TABLET (400 MG TOTAL) BY MOUTH 3 (THREE) TIMES DAILY. 21 tablet 3  . buPROPion (WELLBUTRIN SR) 150 MG 12 hr tablet TAKE 1 TABLET (150 MG TOTAL) BY MOUTH 2 (TWO) TIMES DAILY. 180 tablet 2  . azithromycin (ZITHROMAX Z-PAK) 250 MG tablet As directed (Patient not taking: Reported on 01/06/2016) 6 each 0  . benzonatate (TESSALON) 200 MG capsule Take 1 capsule (200 mg total) by mouth 2 (two) times daily as needed for cough. (Patient not taking: Reported on 01/06/2016) 35 capsule 1   No facility-administered medications prior to visit.    ROS Review of Systems  Constitutional: Negative for chills, activity change, appetite change, fatigue and unexpected weight change.  HENT: Negative for congestion, mouth sores and sinus pressure.   Eyes: Negative for visual disturbance.  Respiratory: Negative for cough and chest tightness.   Gastrointestinal: Negative for nausea and abdominal pain.  Genitourinary: Negative for frequency, difficulty urinating and vaginal pain.  Musculoskeletal: Negative for back pain and gait problem.  Skin: Negative for pallor and rash.  Neurological: Negative for dizziness, tremors, weakness, numbness and headaches.  Psychiatric/Behavioral: Negative for suicidal ideas, confusion and sleep disturbance. The patient is not nervous/anxious.     Objective:    BP 132/84 mmHg  Pulse 75  Temp(Src) 97.7 F (36.5 C) (Oral)  Ht 5\' 2"  (1.575 m)  Wt 164 lb (74.39 kg)  BMI 29.99 kg/m2  SpO2 97%  BP Readings from Last 3 Encounters:  01/06/16 132/84  11/12/15 130/78  12/31/14 144/86    Wt Readings from Last 3 Encounters:  01/06/16 164 lb (74.39 kg)  11/12/15 165 lb (74.844 kg)  12/31/14 146 lb 12.8 oz (66.588 kg)    Physical Exam  Constitutional: She appears well-developed. No distress.  HENT:  Head: Normocephalic.  Right Ear: External ear normal.  Left Ear: External ear normal.  Nose: Nose normal.  Mouth/Throat: Oropharynx is clear and moist.  Eyes: Conjunctivae are normal. Pupils are equal, round, and reactive to light. Right eye exhibits no discharge. Left eye exhibits no discharge.  Neck: Normal range of motion. Neck supple. No JVD present. No tracheal deviation present. No thyromegaly present.  Cardiovascular: Normal rate, regular rhythm and normal heart sounds.   Pulmonary/Chest: No stridor. No respiratory distress. She has no wheezes.  Abdominal: Soft. Bowel sounds are normal. She exhibits no distension and no mass. There is no tenderness. There is no rebound and no guarding.  Musculoskeletal: She exhibits no edema or tenderness.  Lymphadenopathy:    She has no cervical adenopathy.  Neurological: She displays normal reflexes. No cranial nerve deficit. She exhibits normal muscle tone. Coordination normal.  Skin: No rash noted. No erythema.  Psychiatric: She has a normal mood and affect. Her behavior is normal. Judgment and thought content normal.    Lab Results  Component Value Date   WBC 3.9* 06/26/2014   HGB 13.9 06/26/2014   HCT 40.8 06/26/2014   PLT 259.0 06/26/2014   GLUCOSE 95 06/26/2014   CHOL 218* 06/26/2014   TRIG 61.0 06/26/2014   HDL 75.80 06/26/2014   LDLDIRECT 106.8 04/12/2012   LDLCALC 130* 06/26/2014   ALT 27 06/26/2014   AST 32 06/26/2014   NA 138 06/26/2014   K 4.0 06/26/2014   CL 101 06/26/2014    CREATININE 0.8 06/26/2014   BUN 17 06/26/2014   CO2 30 06/26/2014   TSH 0.83 06/26/2014   INR 0.91 05/13/2012    Dg Esophagus  02/03/2013  *RADIOLOGY REPORT* Clinical Data:Difficulty swallowing with history of esophageal dilatation. ESOPHAGUS/BARIUM SWALLOW/TABLET STUDY Fluoroscopy Time: 37 seconds. Comparison: None. Findings: Swallowing mechanism is unremarkable.  Esophageal motility is normal.  No esophageal fold thickening, stricture or obstruction.  A 13 mm barium pill passed into the stomach without difficulty. IMPRESSION: Normal study. Original Report Authenticated By: Lorin Picket, M.D.    Assessment & Plan:   There are no diagnoses linked to this encounter. I have discontinued Ms. Heiland's azithromycin and benzonatate. I am also having her maintain her Calcium Carb-Cholecalciferol (CALCIUM 600 + D PO), Multiple Vitamins-Minerals (MATURE ADULT CENTURY PO), pantoprazole, amoxicillin, acyclovir, and buPROPion.  Meds ordered this encounter  Medications  . amoxicillin (AMOXIL) 500 MG capsule    Sig: TAKE ALL 4 CAPSULES BY MOUTH 1 HOUR PRIOR TO DENTAL WORK    Refill:  3  . acyclovir (ZOVIRAX) 400 MG tablet    Sig: TAKE 1 TABLET (400 MG TOTAL) BY MOUTH 3 (THREE) TIMES DAILY.    Dispense:  21 tablet    Refill:  3  . buPROPion (WELLBUTRIN SR) 150 MG 12 hr tablet    Sig: Take 1 tablet (150 mg total) by mouth 2 (two) times daily.    Dispense:  180 tablet    Refill:  3     Follow-up: No Follow-up on file.  Walker Kehr, MD

## 2016-01-06 NOTE — Assessment & Plan Note (Signed)
Zpac 

## 2016-01-07 LAB — VITAMIN B12: VITAMIN B 12: 399 pg/mL (ref 211–911)

## 2016-01-07 LAB — HEPATITIS C ANTIBODY: HCV AB: NEGATIVE

## 2016-01-07 LAB — TSH: TSH: 1.06 u[IU]/mL (ref 0.35–4.50)

## 2016-06-08 ENCOUNTER — Telehealth: Payer: Self-pay | Admitting: Emergency Medicine

## 2016-06-08 NOTE — Telephone Encounter (Signed)
Please advise 

## 2016-06-08 NOTE — Telephone Encounter (Signed)
I can see her Thx

## 2016-06-08 NOTE — Telephone Encounter (Signed)
Pt called and stated she is having problems and pain in her hands. I advised her to make an office visit. She wants to know if you want to see he or refer her to somebody else. Please advise. Thanks.

## 2016-06-09 NOTE — Telephone Encounter (Signed)
Called patient and scheduled.

## 2016-06-09 NOTE — Telephone Encounter (Signed)
Please call patient for an appointment.

## 2016-06-21 ENCOUNTER — Ambulatory Visit: Payer: 59 | Admitting: Internal Medicine

## 2016-06-27 ENCOUNTER — Ambulatory Visit (INDEPENDENT_AMBULATORY_CARE_PROVIDER_SITE_OTHER): Payer: BLUE CROSS/BLUE SHIELD | Admitting: Internal Medicine

## 2016-06-27 ENCOUNTER — Encounter: Payer: Self-pay | Admitting: Internal Medicine

## 2016-06-27 ENCOUNTER — Other Ambulatory Visit (INDEPENDENT_AMBULATORY_CARE_PROVIDER_SITE_OTHER): Payer: BLUE CROSS/BLUE SHIELD

## 2016-06-27 VITALS — BP 120/80 | HR 73 | Wt 171.0 lb

## 2016-06-27 DIAGNOSIS — K219 Gastro-esophageal reflux disease without esophagitis: Secondary | ICD-10-CM

## 2016-06-27 DIAGNOSIS — M79643 Pain in unspecified hand: Secondary | ICD-10-CM | POA: Diagnosis not present

## 2016-06-27 DIAGNOSIS — Z23 Encounter for immunization: Secondary | ICD-10-CM

## 2016-06-27 DIAGNOSIS — G4700 Insomnia, unspecified: Secondary | ICD-10-CM

## 2016-06-27 DIAGNOSIS — E669 Obesity, unspecified: Secondary | ICD-10-CM | POA: Diagnosis not present

## 2016-06-27 LAB — TSH: TSH: 0.93 u[IU]/mL (ref 0.35–4.50)

## 2016-06-27 LAB — BASIC METABOLIC PANEL
BUN: 12 mg/dL (ref 6–23)
CALCIUM: 9.6 mg/dL (ref 8.4–10.5)
CO2: 33 mEq/L — ABNORMAL HIGH (ref 19–32)
Chloride: 102 mEq/L (ref 96–112)
Creatinine, Ser: 0.85 mg/dL (ref 0.40–1.20)
GFR: 70.93 mL/min (ref >60.00)
Glucose, Bld: 113 mg/dL — ABNORMAL HIGH (ref 70–99)
Potassium: 4.3 mEq/L (ref 3.5–5.1)
SODIUM: 142 meq/L (ref 135–145)

## 2016-06-27 LAB — HEPATIC FUNCTION PANEL
ALT: 17 U/L (ref 0–35)
AST: 21 U/L (ref 0–37)
Albumin: 4.6 g/dL (ref 3.5–5.2)
Alkaline Phosphatase: 64 U/L (ref 39–117)
Bilirubin, Direct: 0.1 mg/dL (ref 0.0–0.3)
TOTAL PROTEIN: 7.3 g/dL (ref 6.0–8.3)
Total Bilirubin: 0.5 mg/dL (ref 0.2–1.2)

## 2016-06-27 LAB — URIC ACID: Uric Acid, Serum: 5.2 mg/dL (ref 2.4–7.0)

## 2016-06-27 LAB — VITAMIN B12: Vitamin B-12: 1369 pg/mL — ABNORMAL HIGH (ref 211–911)

## 2016-06-27 LAB — SEDIMENTATION RATE: SED RATE: 9 mm/h (ref 0–30)

## 2016-06-27 MED ORDER — ZALEPLON 5 MG PO CAPS: 5.0000 mg | ORAL_CAPSULE | Freq: Every evening | ORAL | 2 refills | Status: DC | PRN

## 2016-06-27 MED ORDER — PHENTERMINE HCL 37.5 MG PO TABS: 37.5000 mg | ORAL_TABLET | Freq: Every day | ORAL | 2 refills | Status: DC

## 2016-06-27 MED ORDER — MELOXICAM 7.5 MG PO TABS: 7.5000 mg | ORAL_TABLET | Freq: Every day | ORAL | 2 refills | Status: DC | PRN

## 2016-06-27 NOTE — Assessment & Plan Note (Signed)
B [facility] Labs Meloxicam w/caution (on Protonix)

## 2016-06-27 NOTE — Assessment & Plan Note (Signed)
Zaleplon prn   Potential benefits of a long term benzodiazepines  use as well as potential risks  and complications were explained to the patient and were aknowledged. 

## 2016-06-27 NOTE — Assessment & Plan Note (Addendum)
Worse  Try Phentermine  Potential benefits of a long term phentermine  use as well as potential risks  and complications were explained to the patient and were aknowledged.

## 2016-06-27 NOTE — Assessment & Plan Note (Signed)
on Protonix  Loose wt

## 2016-06-27 NOTE — Progress Notes (Signed)
Subjective:  Patient ID: Natalie Fletcher, female    DOB: 1951-06-07  Age: 65 y.o. MRN: [id number]  CC: Hand Pain (bilateral, hasn't had anything for the pain. )   HPI Natalie Fletcher presents for B hands hurt - long time. No swelling. F/u GERD. C/o insomnia. C/o obesity and wt gain  Outpatient Medications Prior to Visit  Medication Sig Dispense Refill  . acyclovir (ZOVIRAX) 400 MG tablet TAKE 1 TABLET (400 MG TOTAL) BY MOUTH 3 (THREE) TIMES DAILY. 21 tablet 3  . amoxicillin (AMOXIL) 500 MG capsule TAKE ALL 4 CAPSULES BY MOUTH 1 HOUR PRIOR TO DENTAL WORK  3  . azithromycin (ZITHROMAX Z-PAK) 250 MG tablet As directed 6 each 0  . buPROPion (WELLBUTRIN SR) 150 MG 12 hr tablet Take 1 tablet (150 mg total) by mouth 2 (two) times daily. 180 tablet 3  . Calcium Carb-Cholecalciferol (CALCIUM 600 + D PO) Take 1 each by mouth daily.    . Multiple Vitamins-Minerals (MATURE ADULT CENTURY PO) Take by mouth daily.    . pantoprazole (PROTONIX) 40 MG tablet TAKE 1 TABLET BY MOUTH EVERY DAY 30 MINUTES BEFORE A MEAL 90 tablet 3  . zaleplon (SONATA) 5 MG capsule Take 1-2 capsules (5-10 mg total) by mouth at bedtime as needed for sleep. 60 capsule 3   No facility-administered medications prior to visit.     ROS Review of Systems  Constitutional: Negative for activity change, appetite change, chills, fatigue and unexpected weight change.  HENT: Negative for congestion, mouth sores and sinus pressure.   Eyes: Negative for visual disturbance.  Respiratory: Negative for cough and chest tightness.   Gastrointestinal: Negative for abdominal pain and nausea.  Genitourinary: Negative for difficulty urinating, frequency and vaginal pain.  Musculoskeletal: Positive for arthralgias. Negative for back pain and gait problem.  Skin: Negative for pallor and rash.  Neurological: Negative for dizziness, tremors, weakness, numbness and headaches.  Psychiatric/Behavioral: Positive for sleep disturbance. Negative for  confusion and suicidal ideas.    Objective:  BP 120/80   Pulse 73   Wt 171 lb (77.6 kg)   SpO2 96%   BMI 31.28 kg/m   BP Readings from Last 3 Encounters:  06/27/16 120/80  01/06/16 132/84  11/12/15 130/78    Wt Readings from Last 3 Encounters:  06/27/16 171 lb (77.6 kg)  01/06/16 164 lb (74.4 kg)  11/12/15 165 lb (74.8 kg)    Physical Exam  Constitutional: She appears well-developed. No distress.  HENT:  Head: Normocephalic.  Right Ear: External ear normal.  Left Ear: External ear normal.  Nose: Nose normal.  Mouth/Throat: Oropharynx is clear and moist.  Eyes: Conjunctivae are normal. Pupils are equal, round, and reactive to light. Right eye exhibits no discharge. Left eye exhibits no discharge.  Neck: Normal range of motion. Neck supple. No JVD present. No tracheal deviation present. No thyromegaly present.  Cardiovascular: Normal rate, regular rhythm and normal heart sounds.   Pulmonary/Chest: No stridor. No respiratory distress. She has no wheezes.  Abdominal: Soft. Bowel sounds are normal. She exhibits no distension and no mass. There is no tenderness. There is no rebound and no guarding.  Musculoskeletal: She exhibits no edema or tenderness.  Lymphadenopathy:    She has no cervical adenopathy.  Neurological: She displays normal reflexes. No cranial nerve deficit. She exhibits normal muscle tone. Coordination normal.  Skin: No rash noted. No erythema.  Psychiatric: She has a normal mood and affect. Her behavior is normal. Judgment and thought content  normal.  hands with sensitive MCPs B Obese  Lab Results  Component Value Date   WBC 6.3 01/06/2016   HGB 13.9 01/06/2016   HCT 41.1 01/06/2016   PLT 358.0 01/06/2016   GLUCOSE 100 (H) 01/06/2016   CHOL 244 (H) 01/06/2016   TRIG 71.0 01/06/2016   HDL 92.30 01/06/2016   LDLDIRECT 106.8 04/12/2012   LDLCALC 137 (H) 01/06/2016   ALT 17 01/06/2016   AST 21 01/06/2016   NA 142 01/06/2016   K 4.6 01/06/2016   CL  102 01/06/2016   CREATININE 0.86 01/06/2016   BUN 11 01/06/2016   CO2 32 01/06/2016   TSH 1.06 01/06/2016   INR 0.91 05/13/2012    Dg Esophagus  Result Date: 02/03/2013 *RADIOLOGY REPORT* Clinical Data:Difficulty swallowing with history of esophageal dilatation. ESOPHAGUS/BARIUM SWALLOW/TABLET STUDY Fluoroscopy Time: 37 seconds. Comparison: None. Findings: Swallowing mechanism is unremarkable.  Esophageal motility is normal.  No esophageal fold thickening, stricture or obstruction.  A 13 mm barium pill passed into the stomach without difficulty. IMPRESSION: Normal study. Original Report Authenticated By: Lorin Picket, M.D.    Assessment & Plan:   Natalie Fletcher was seen today for hand pain.  Diagnoses and all orders for this visit:  Need for influenza vaccination -     Flu vaccine HIGH DOSE PF (Fluzone High dose)   I am having Ms. Fess maintain her Calcium Carb-Cholecalciferol (CALCIUM 600 + D PO), Multiple Vitamins-Minerals (MATURE ADULT CENTURY PO), amoxicillin, buPROPion, zaleplon, azithromycin, acyclovir, and pantoprazole.  No orders of the defined types were placed in this encounter.    Follow-up: No Follow-up on file.  Walker Kehr, MD

## 2016-06-28 LAB — RHEUMATOID FACTOR

## 2016-09-28 DIAGNOSIS — Z96651 Presence of right artificial knee joint: Secondary | ICD-10-CM | POA: Insufficient documentation

## 2016-10-25 ENCOUNTER — Ambulatory Visit (INDEPENDENT_AMBULATORY_CARE_PROVIDER_SITE_OTHER): Payer: BLUE CROSS/BLUE SHIELD | Admitting: Internal Medicine

## 2016-10-25 ENCOUNTER — Ambulatory Visit (INDEPENDENT_AMBULATORY_CARE_PROVIDER_SITE_OTHER)
Admission: RE | Admit: 2016-10-25 | Discharge: 2016-10-25 | Disposition: A | Discharge status: Home / Self Care | Payer: BLUE CROSS/BLUE SHIELD | Source: Ambulatory Visit | Attending: Internal Medicine | Admitting: Internal Medicine

## 2016-10-25 ENCOUNTER — Encounter: Payer: Self-pay | Admitting: Internal Medicine

## 2016-10-25 VITALS — BP 120/88 | HR 83 | Wt 170.0 lb

## 2016-10-25 DIAGNOSIS — M5441 Lumbago with sciatica, right side: Secondary | ICD-10-CM | POA: Diagnosis not present

## 2016-10-25 DIAGNOSIS — M79604 Pain in right leg: Secondary | ICD-10-CM | POA: Diagnosis not present

## 2016-10-25 DIAGNOSIS — F32A Depression, unspecified: Secondary | ICD-10-CM

## 2016-10-25 DIAGNOSIS — K219 Gastro-esophageal reflux disease without esophagitis: Secondary | ICD-10-CM | POA: Diagnosis not present

## 2016-10-25 DIAGNOSIS — E6609 Other obesity due to excess calories: Secondary | ICD-10-CM

## 2016-10-25 DIAGNOSIS — Z6831 Body mass index (BMI) 31.0-31.9, adult: Secondary | ICD-10-CM

## 2016-10-25 DIAGNOSIS — F329 Major depressive disorder, single episode, unspecified: Secondary | ICD-10-CM

## 2016-10-25 DIAGNOSIS — M5442 Lumbago with sciatica, left side: Secondary | ICD-10-CM

## 2016-10-25 MED ORDER — MELOXICAM 7.5 MG PO TABS: 7.5000 mg | ORAL_TABLET | Freq: Every day | ORAL | 1 refills | Status: DC | PRN

## 2016-10-25 MED ORDER — PHENTERMINE HCL 37.5 MG PO TABS: 37.5000 mg | ORAL_TABLET | Freq: Every day | ORAL | 2 refills | Status: DC

## 2016-10-25 MED ORDER — AZITHROMYCIN 250 MG PO TABS: ORAL_TABLET | ORAL | 0 refills | Status: DC

## 2016-10-25 MED ORDER — AMOXICILLIN 500 MG PO CAPS: 2000.0000 mg | ORAL_CAPSULE | Freq: Every day | ORAL | 3 refills | Status: DC

## 2016-10-25 MED ORDER — ACYCLOVIR 400 MG PO TABS: ORAL_TABLET | ORAL | 3 refills | Status: DC

## 2016-10-25 NOTE — Assessment & Plan Note (Addendum)
F/u w/Dr Jaquita Rector ray Low dose Meloxicam prn

## 2016-10-25 NOTE — Progress Notes (Signed)
Pre visit review using our clinic review tool, if applicable. No additional management support is needed unless otherwise documented below in the visit note. 

## 2016-10-25 NOTE — Assessment & Plan Note (Signed)
On Wellbutrin

## 2016-10-25 NOTE — Progress Notes (Signed)
Subjective:  Patient ID: Natalie Fletcher, female    DOB: 1951-08-01  Age: 66 y.o. MRN: [id number]  CC: No chief complaint on file.   HPI Natalie Fletcher presents for depression, insomnia and GERD f/u  L leg pain and bruise in Nov 2017 - resolved  Outpatient Medications Prior to Visit  Medication Sig Dispense Refill  . acyclovir (ZOVIRAX) 400 MG tablet TAKE 1 TABLET (400 MG TOTAL) BY MOUTH 3 (THREE) TIMES DAILY. 21 tablet 3  . amoxicillin (AMOXIL) 500 MG capsule TAKE ALL 4 CAPSULES BY MOUTH 1 HOUR PRIOR TO DENTAL WORK  3  . buPROPion (WELLBUTRIN SR) 150 MG 12 hr tablet Take 1 tablet (150 mg total) by mouth 2 (two) times daily. 180 tablet 3  . Calcium Carb-Cholecalciferol (CALCIUM 600 + D PO) Take 1 each by mouth daily.    . meloxicam (MOBIC) 7.5 MG tablet Take 1 tablet (7.5 mg total) by mouth daily as needed for pain. 30 tablet 2  . Multiple Vitamins-Minerals (MATURE ADULT CENTURY PO) Take by mouth daily.    . pantoprazole (PROTONIX) 40 MG tablet TAKE 1 TABLET BY MOUTH EVERY DAY 30 MINUTES BEFORE A MEAL 90 tablet 3  . zaleplon (SONATA) 5 MG capsule Take 1-2 capsules (5-10 mg total) by mouth at bedtime as needed for sleep. 60 capsule 2  . phentermine (ADIPEX-P) 37.5 MG tablet Take 1 tablet (37.5 mg total) by mouth daily before breakfast. 30 tablet 2   No facility-administered medications prior to visit.     ROS Review of Systems  Constitutional: Negative for activity change, appetite change, chills, fatigue and unexpected weight change.  HENT: Negative for congestion, mouth sores and sinus pressure.   Eyes: Negative for visual disturbance.  Respiratory: Negative for cough and chest tightness.   Gastrointestinal: Negative for abdominal pain and nausea.  Genitourinary: Negative for difficulty urinating, frequency and vaginal pain.  Musculoskeletal: Positive for arthralgias. Negative for back pain and gait problem.  Skin: Negative for pallor and rash.  Neurological: Negative for  dizziness, tremors, weakness, numbness and headaches.  Psychiatric/Behavioral: Positive for sleep disturbance. Negative for confusion.    Objective:  BP 120/88   Pulse 83   Wt 170 lb (77.1 kg)   SpO2 97%   BMI 31.09 kg/m   BP Readings from Last 3 Encounters:  10/25/16 120/88  06/27/16 120/80  01/06/16 132/84    Wt Readings from Last 3 Encounters:  10/25/16 170 lb (77.1 kg)  06/27/16 171 lb (77.6 kg)  01/06/16 164 lb (74.4 kg)    Physical Exam  Constitutional: She appears well-developed. No distress.  HENT:  Head: Normocephalic.  Right Ear: External ear normal.  Left Ear: External ear normal.  Nose: Nose normal.  Mouth/Throat: Oropharynx is clear and moist.  Eyes: Conjunctivae are normal. Pupils are equal, round, and reactive to light. Right eye exhibits no discharge. Left eye exhibits no discharge.  Neck: Normal range of motion. Neck supple. No JVD present. No tracheal deviation present. No thyromegaly present.  Cardiovascular: Normal rate, regular rhythm and normal heart sounds.   Pulmonary/Chest: No stridor. No respiratory distress. She has no wheezes.  Abdominal: Soft. Bowel sounds are normal. She exhibits no distension and no mass. There is no tenderness. There is no rebound and no guarding.  Musculoskeletal: She exhibits tenderness. She exhibits no edema.  Lymphadenopathy:    She has no cervical adenopathy.  Neurological: She displays normal reflexes. No cranial nerve deficit. She exhibits normal muscle tone. Coordination normal.  Skin: No rash noted. No erythema.  Psychiatric: She has a normal mood and affect. Her behavior is normal. Judgment and thought content normal.  R post knee is tender  Lab Results  Component Value Date   WBC 6.3 01/06/2016   HGB 13.9 01/06/2016   HCT 41.1 01/06/2016   PLT 358.0 01/06/2016   GLUCOSE 113 (H) 06/27/2016   CHOL 244 (H) 01/06/2016   TRIG 71.0 01/06/2016   HDL 92.30 01/06/2016   LDLDIRECT 106.8 04/12/2012   LDLCALC 137  (H) 01/06/2016   ALT 17 06/27/2016   AST 21 06/27/2016   NA 142 06/27/2016   K 4.3 06/27/2016   CL 102 06/27/2016   CREATININE 0.85 06/27/2016   BUN 12 06/27/2016   CO2 33 (H) 06/27/2016   TSH 0.93 06/27/2016   INR 0.91 05/13/2012    Dg Esophagus  Result Date: 02/03/2013 *RADIOLOGY REPORT* Clinical Data:Difficulty swallowing with history of esophageal dilatation. ESOPHAGUS/BARIUM SWALLOW/TABLET STUDY Fluoroscopy Time: 37 seconds. Comparison: None. Findings: Swallowing mechanism is unremarkable.  Esophageal motility is normal.  No esophageal fold thickening, stricture or obstruction.  A 13 mm barium pill passed into the stomach without difficulty. IMPRESSION: Normal study. Original Report Authenticated By: Lorin Picket, M.D.    Assessment & Plan:   There are no diagnoses linked to this encounter. I am having Natalie Fletcher maintain her Calcium Carb-Cholecalciferol (CALCIUM 600 + D PO), Multiple Vitamins-Minerals (MATURE ADULT CENTURY PO), amoxicillin, buPROPion, acyclovir, pantoprazole, zaleplon, meloxicam, and phentermine.  No orders of the defined types were placed in this encounter.    Follow-up: No Follow-up on file.  Walker Kehr, MD

## 2016-10-25 NOTE — Assessment & Plan Note (Signed)
Low dose Meloxicam prn

## 2016-10-25 NOTE — Assessment & Plan Note (Signed)
Wt Readings from Last 3 Encounters:  10/25/16 170 lb (77.1 kg)  06/27/16 171 lb (77.6 kg)  01/06/16 164 lb (74.4 kg)  Phentermine

## 2016-10-25 NOTE — Assessment & Plan Note (Signed)
Protonix.  ?

## 2016-12-22 ENCOUNTER — Other Ambulatory Visit: Payer: Self-pay | Admitting: Internal Medicine

## 2016-12-28 ENCOUNTER — Other Ambulatory Visit: Payer: Self-pay | Admitting: Internal Medicine

## 2017-01-28 ENCOUNTER — Other Ambulatory Visit: Payer: Self-pay | Admitting: Internal Medicine

## 2017-01-29 NOTE — Telephone Encounter (Signed)
Routing to dr plotnikov, please advise, thanks 

## 2017-02-08 ENCOUNTER — Other Ambulatory Visit: Payer: Self-pay

## 2017-02-08 MED ORDER — MELOXICAM 7.5 MG PO TABS: 7.5000 mg | ORAL_TABLET | Freq: Every day | ORAL | 0 refills | Status: DC | PRN

## 2017-02-08 NOTE — Telephone Encounter (Signed)
Received fax from [facility] from Meloxicam, patient need yearly physical before refills will be given

## 2017-03-08 ENCOUNTER — Other Ambulatory Visit: Payer: Self-pay | Admitting: Internal Medicine

## 2017-03-20 ENCOUNTER — Other Ambulatory Visit: Payer: Self-pay | Admitting: Internal Medicine

## 2017-04-16 ENCOUNTER — Other Ambulatory Visit: Payer: Self-pay | Admitting: Internal Medicine

## 2017-04-21 ENCOUNTER — Other Ambulatory Visit: Payer: Self-pay | Admitting: Internal Medicine

## 2017-04-23 NOTE — Telephone Encounter (Signed)
Routing to dr plotnikov, please advise, thanks 

## 2017-07-03 ENCOUNTER — Other Ambulatory Visit: Payer: Self-pay | Admitting: Internal Medicine

## 2017-07-04 NOTE — Telephone Encounter (Signed)
Called refill into [facility] had to leave on pharmacy vm../lmb 

## 2017-09-12 ENCOUNTER — Encounter: Payer: Self-pay | Admitting: Internal Medicine

## 2017-09-12 ENCOUNTER — Ambulatory Visit: Payer: BLUE CROSS/BLUE SHIELD | Admitting: Internal Medicine

## 2017-09-12 VITALS — BP 124/80 | HR 64 | Temp 98.0°F | Ht 62.0 in | Wt 171.0 lb

## 2017-09-12 DIAGNOSIS — F32A Depression, unspecified: Secondary | ICD-10-CM

## 2017-09-12 DIAGNOSIS — Z6831 Body mass index (BMI) 31.0-31.9, adult: Secondary | ICD-10-CM

## 2017-09-12 DIAGNOSIS — F329 Major depressive disorder, single episode, unspecified: Secondary | ICD-10-CM | POA: Diagnosis not present

## 2017-09-12 DIAGNOSIS — Z0000 Encounter for general adult medical examination without abnormal findings: Secondary | ICD-10-CM

## 2017-09-12 DIAGNOSIS — M159 Polyosteoarthritis, unspecified: Secondary | ICD-10-CM | POA: Diagnosis not present

## 2017-09-12 DIAGNOSIS — E6609 Other obesity due to excess calories: Secondary | ICD-10-CM | POA: Diagnosis not present

## 2017-09-12 MED ORDER — PHENTERMINE HCL 37.5 MG PO TABS: 37.5000 mg | ORAL_TABLET | Freq: Every day | ORAL | 2 refills | Status: DC

## 2017-09-12 MED ORDER — AMOXICILLIN 500 MG PO CAPS: 2000.0000 mg | ORAL_CAPSULE | Freq: Every day | ORAL | 3 refills | Status: DC

## 2017-09-12 MED ORDER — ZALEPLON 5 MG PO CAPS: ORAL_CAPSULE | ORAL | 3 refills | Status: DC

## 2017-09-12 NOTE — Assessment & Plan Note (Signed)
Meloxicam Vit D

## 2017-09-12 NOTE — Patient Instructions (Signed)
Try Turmeric pills

## 2017-09-12 NOTE — Progress Notes (Signed)
Subjective:  Patient ID: Natalie Fletcher, female    DOB: 1950-11-29  Age: 66 y.o. MRN: [id number]  CC: No chief complaint on file.   HPI Natalie Fletcher presents for hand arthritis pain, anxiety, insomnia f/u. C/o wt gain   Outpatient Medications Prior to Visit  Medication Sig Dispense Refill  . acyclovir (ZOVIRAX) 400 MG tablet TAKE 1 TABLET (400 MG TOTAL) BY MOUTH 3 (THREE) TIMES DAILY. 21 tablet 3  . amoxicillin (AMOXIL) 500 MG capsule Take 4 capsules (2,000 mg total) by mouth daily. 12 capsule 3  . buPROPion (WELLBUTRIN SR) 150 MG 12 hr tablet TAKE 1 TABLET BY MOUTH TWICE A DAY 60 tablet 5  . Calcium Carb-Cholecalciferol (CALCIUM 600 + D PO) Take 1 each by mouth daily.    . meloxicam (MOBIC) 7.5 MG tablet Take 1 tablet (7.5 mg total) by mouth daily as needed for pain. Yearly physical is due before refills will be given 30 tablet 0  . Multiple Vitamins-Minerals (MATURE ADULT CENTURY PO) Take by mouth daily.    . pantoprazole (PROTONIX) 40 MG tablet TAKE 1 TABLET BY MOUTH EVERY DAY 30 MINUTES BEFORE A MEAL 90 tablet 3  . zaleplon (SONATA) 5 MG capsule TAKE 1-2 CAPSULES BY MOUTH AT BEDTIME AS NEEDED FOR SLEEP 60 capsule 2  . phentermine (ADIPEX-P) 37.5 MG tablet Take 1 tablet (37.5 mg total) by mouth daily before breakfast. 30 tablet 2  . azithromycin (ZITHROMAX Z-PAK) 250 MG tablet As directed 6 each 0   No facility-administered medications prior to visit.     ROS Review of Systems  Constitutional: Positive for unexpected weight change. Negative for activity change, appetite change, chills and fatigue.  HENT: Negative for congestion, mouth sores and sinus pressure.   Eyes: Negative for visual disturbance.  Respiratory: Negative for cough and chest tightness.   Gastrointestinal: Negative for abdominal pain and nausea.  Genitourinary: Negative for difficulty urinating, frequency and vaginal pain.  Musculoskeletal: Positive for arthralgias. Negative for back pain and gait  problem.  Skin: Negative for pallor and rash.  Neurological: Negative for dizziness, tremors, weakness, numbness and headaches.  Psychiatric/Behavioral: Positive for sleep disturbance. Negative for confusion and suicidal ideas. The patient is not nervous/anxious.     Objective:  BP 124/80 (BP Location: Left Arm, Patient Position: Sitting, Cuff Size: Large)   Pulse 64   Temp 98 F (36.7 C) (Oral)   Ht 5\' 2"  (1.575 m)   Wt 171 lb (77.6 kg)   SpO2 98%   BMI 31.28 kg/m   BP Readings from Last 3 Encounters:  09/12/17 124/80  10/25/16 120/88  06/27/16 120/80    Wt Readings from Last 3 Encounters:  09/12/17 171 lb (77.6 kg)  10/25/16 170 lb (77.1 kg)  06/27/16 171 lb (77.6 kg)    Physical Exam  Constitutional: She appears well-developed. No distress.  HENT:  Head: Normocephalic.  Right Ear: External ear normal.  Left Ear: External ear normal.  Nose: Nose normal.  Mouth/Throat: Oropharynx is clear and moist.  Eyes: Conjunctivae are normal. Pupils are equal, round, and reactive to light. Right eye exhibits no discharge. Left eye exhibits no discharge.  Neck: Normal range of motion. Neck supple. No JVD present. No tracheal deviation present. No thyromegaly present.  Cardiovascular: Normal rate, regular rhythm and normal heart sounds.  Pulmonary/Chest: No stridor. No respiratory distress. She has no wheezes.  Abdominal: Soft. Bowel sounds are normal. She exhibits no distension and no mass. There is no tenderness. There is  no rebound and no guarding.  Musculoskeletal: She exhibits tenderness. She exhibits no edema.  Lymphadenopathy:    She has no cervical adenopathy.  Neurological: She displays normal reflexes. No cranial nerve deficit. She exhibits normal muscle tone. Coordination normal.  Skin: No rash noted. No erythema.  Psychiatric: She has a normal mood and affect. Her behavior is normal. Judgment and thought content normal.    Lab Results  Component Value Date   WBC 6.3  01/06/2016   HGB 13.9 01/06/2016   HCT 41.1 01/06/2016   PLT 358.0 01/06/2016   GLUCOSE 113 (H) 06/27/2016   CHOL 244 (H) 01/06/2016   TRIG 71.0 01/06/2016   HDL 92.30 01/06/2016   LDLDIRECT 106.8 04/12/2012   LDLCALC 137 (H) 01/06/2016   ALT 17 06/27/2016   AST 21 06/27/2016   NA 142 06/27/2016   K 4.3 06/27/2016   CL 102 06/27/2016   CREATININE 0.85 06/27/2016   BUN 12 06/27/2016   CO2 33 (H) 06/27/2016   TSH 0.93 06/27/2016   INR 0.91 05/13/2012    Dg Knee Complete 4 Views Right  Result Date: 10/25/2016 CLINICAL DATA:  Right knee pain and swelling for 2 months without known injury. EXAM: RIGHT KNEE - COMPLETE 4+ VIEW COMPARISON:  None. FINDINGS: Status post right medial hemiarthroplasty. Femoral and tibial components appear to be well situated. No fracture, dislocation or joint effusion is noted. Minimal spurring of patella is noted. Mild spurring of tibial spines is noted. IMPRESSION: Status post right medial hemiarthroplasty. No acute abnormality seen in the right knee. Electronically Signed   By: Marijo Conception, M.D.   On: 10/25/2016 12:01    Assessment & Plan:   There are no diagnoses linked to this encounter. I have discontinued [place] Levo. Schoenfeldt's azithromycin. I am also having her maintain her Calcium Carb-Cholecalciferol (CALCIUM 600 + D PO), Multiple Vitamins-Minerals (MATURE ADULT CENTURY PO), acyclovir, amoxicillin, phentermine, pantoprazole, meloxicam, buPROPion, and zaleplon.  No orders of the defined types were placed in this encounter.    Follow-up: No Follow-up on file.  Walker Kehr, MD

## 2017-09-12 NOTE — Assessment & Plan Note (Signed)
Try Phentermine  Potential benefits of a long term phentermine  use as well as potential risks  and complications were explained to the patient and were aknowledged. Will ref to see Dr Leafy Ro

## 2017-09-12 NOTE — Assessment & Plan Note (Signed)
Wellbutrin

## 2017-12-12 ENCOUNTER — Other Ambulatory Visit: Payer: Self-pay | Admitting: Internal Medicine

## 2017-12-30 ENCOUNTER — Encounter: Payer: Self-pay | Admitting: Gastroenterology

## 2018-01-29 IMAGING — DX DG KNEE COMPLETE 4+V*R*
4 series · 4 of 4 positions shown · non-contrast
Comparison: None.

CLINICAL DATA: Right knee pain and swelling for 2 months without
known injury.

EXAM:
RIGHT KNEE - COMPLETE 4+ VIEW

[knee ap]
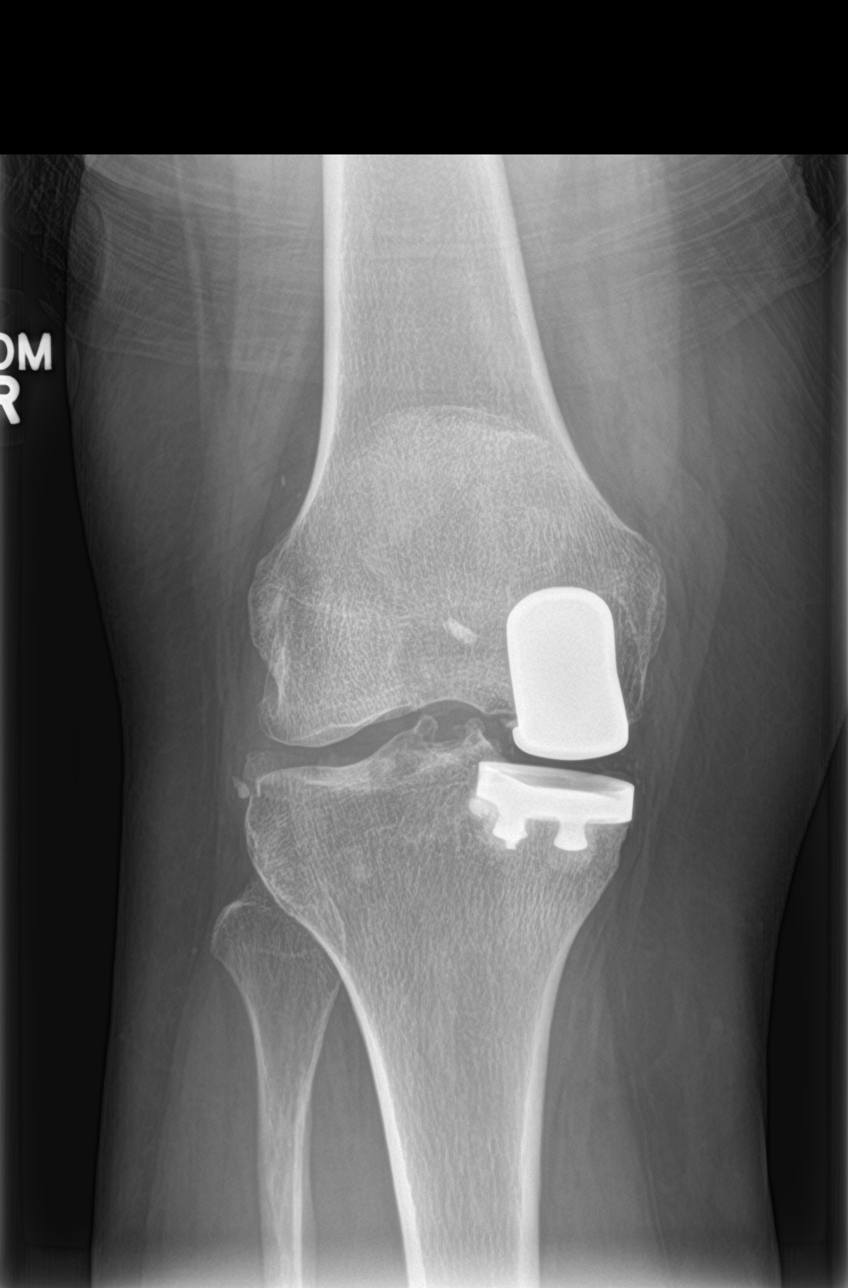

[knee tunnel]
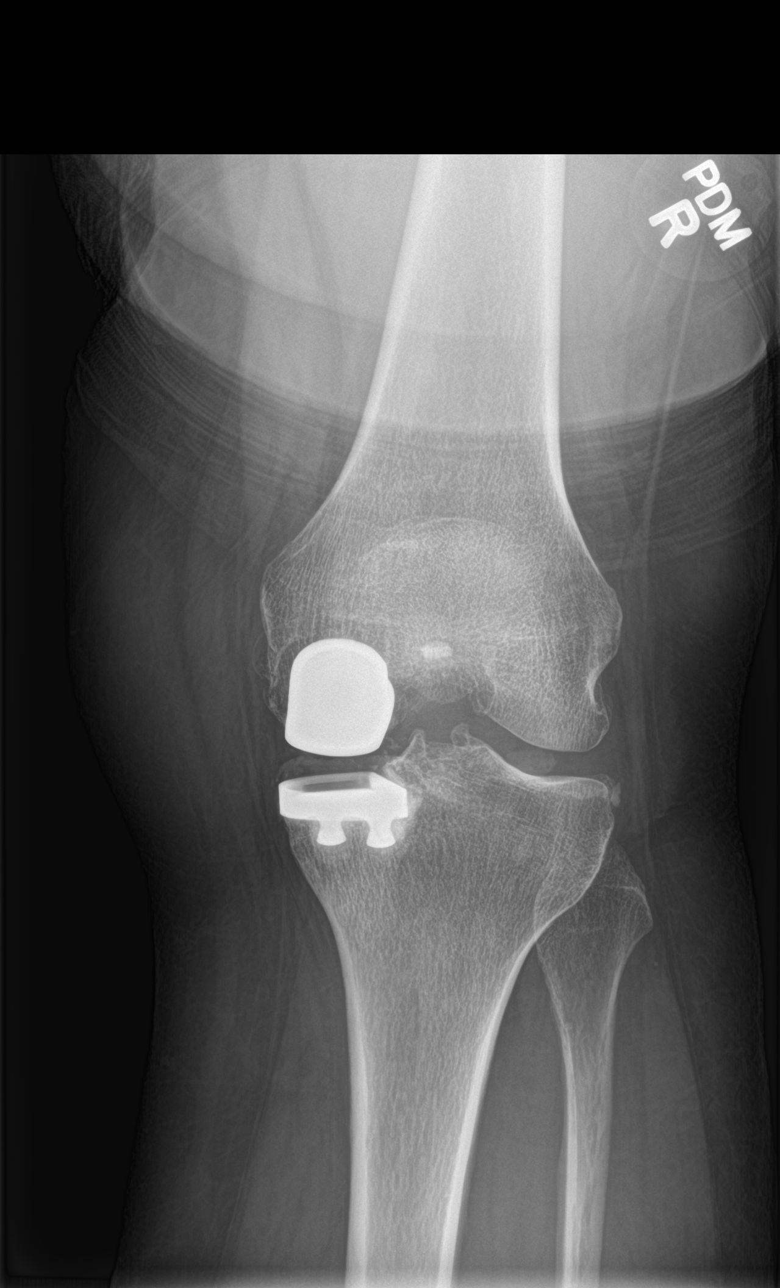

[knee lat]
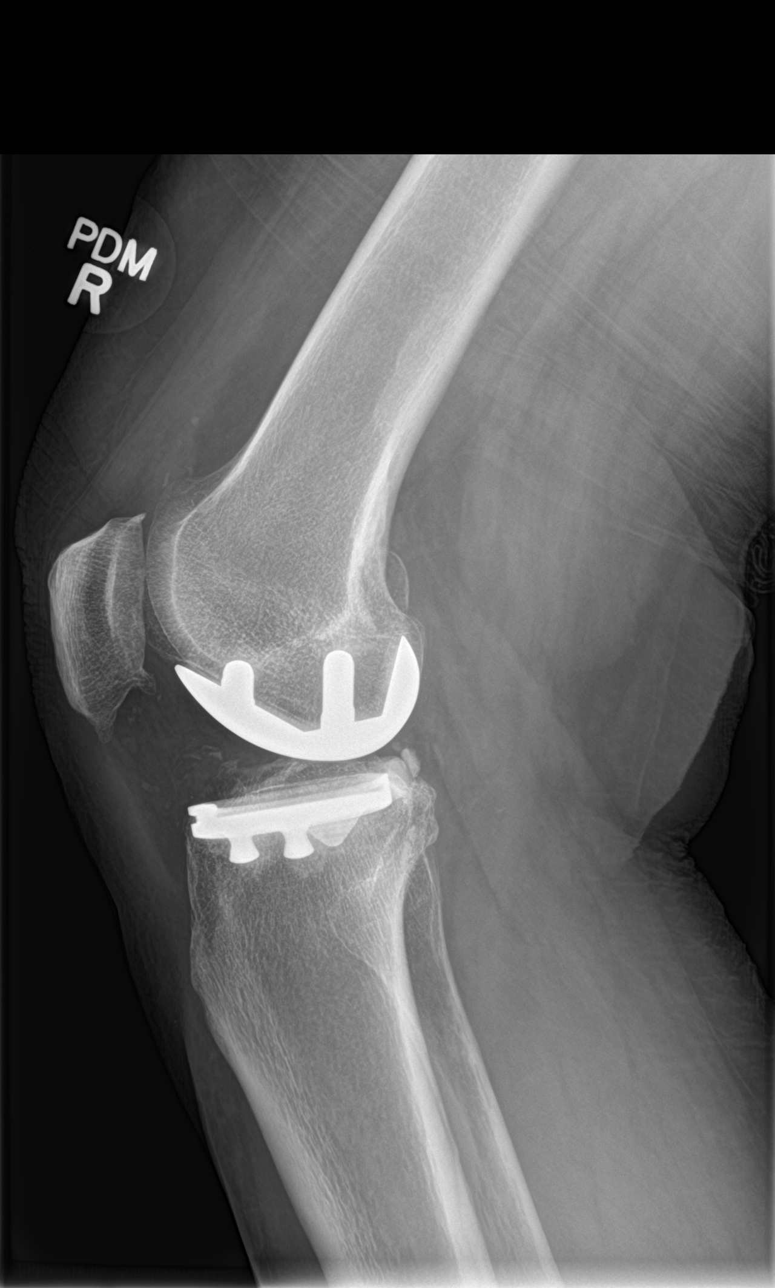

[sunrise]
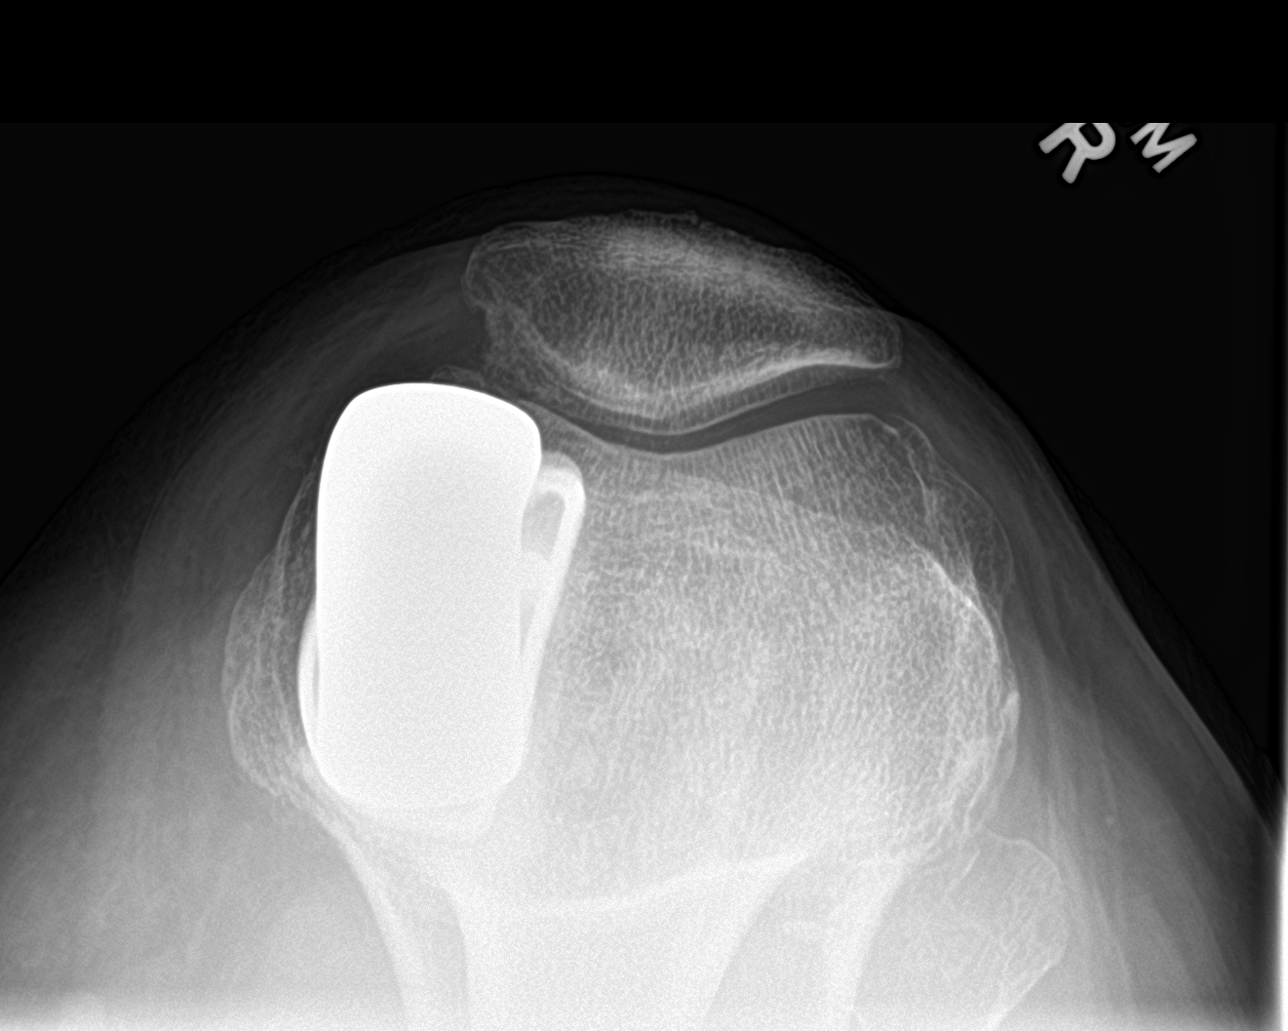

[4 of 4 positions shown; findings below may reference images not displayed]

FINDINGS: Status post right medial hemiarthroplasty. Femoral and tibial
components appear to be well situated. No fracture, dislocation or
joint effusion is noted. Minimal spurring of patella is noted. Mild
spurring of tibial spines is noted.
IMPRESSION: Status post right medial hemiarthroplasty. No acute abnormality seen
in the right knee.

## 2018-06-09 ENCOUNTER — Other Ambulatory Visit: Payer: Self-pay | Admitting: Internal Medicine

## 2018-08-26 ENCOUNTER — Other Ambulatory Visit (INDEPENDENT_AMBULATORY_CARE_PROVIDER_SITE_OTHER): Payer: BLUE CROSS/BLUE SHIELD

## 2018-08-26 ENCOUNTER — Ambulatory Visit: Payer: BLUE CROSS/BLUE SHIELD | Admitting: Internal Medicine

## 2018-08-26 ENCOUNTER — Encounter: Payer: Self-pay | Admitting: Internal Medicine

## 2018-08-26 ENCOUNTER — Ambulatory Visit (INDEPENDENT_AMBULATORY_CARE_PROVIDER_SITE_OTHER)
Admission: RE | Admit: 2018-08-26 | Discharge: 2018-08-26 | Disposition: A | Discharge status: Home / Self Care | Payer: BLUE CROSS/BLUE SHIELD | Source: Ambulatory Visit | Attending: Internal Medicine | Admitting: Internal Medicine

## 2018-08-26 DIAGNOSIS — M5442 Lumbago with sciatica, left side: Secondary | ICD-10-CM | POA: Diagnosis not present

## 2018-08-26 DIAGNOSIS — Z0000 Encounter for general adult medical examination without abnormal findings: Secondary | ICD-10-CM

## 2018-08-26 DIAGNOSIS — F329 Major depressive disorder, single episode, unspecified: Secondary | ICD-10-CM | POA: Diagnosis not present

## 2018-08-26 DIAGNOSIS — E6609 Other obesity due to excess calories: Secondary | ICD-10-CM | POA: Diagnosis not present

## 2018-08-26 DIAGNOSIS — M5441 Lumbago with sciatica, right side: Secondary | ICD-10-CM | POA: Diagnosis not present

## 2018-08-26 DIAGNOSIS — K219 Gastro-esophageal reflux disease without esophagitis: Secondary | ICD-10-CM

## 2018-08-26 DIAGNOSIS — M159 Polyosteoarthritis, unspecified: Secondary | ICD-10-CM

## 2018-08-26 DIAGNOSIS — Z6830 Body mass index (BMI) 30.0-30.9, adult: Secondary | ICD-10-CM

## 2018-08-26 DIAGNOSIS — F32A Depression, unspecified: Secondary | ICD-10-CM

## 2018-08-26 LAB — TSH: TSH: 1.29 u[IU]/mL (ref 0.35–4.50)

## 2018-08-26 LAB — LIPID PANEL
CHOLESTEROL: 233 mg/dL — AB (ref 0–200)
HDL: 73.2 mg/dL (ref >39.00)
LDL CALC: 144 mg/dL — AB (ref 0–99)
NONHDL: 160.24
Total CHOL/HDL Ratio: 3
Triglycerides: 79 mg/dL (ref 0.0–149.0)
VLDL: 15.8 mg/dL (ref 0.0–40.0)

## 2018-08-26 LAB — CBC WITH DIFFERENTIAL/PLATELET
Basophils Absolute: 0 10*3/uL (ref 0.0–0.1)
Basophils Relative: 0.6 % (ref 0.0–3.0)
EOS PCT: 1.3 % (ref 0.0–5.0)
Eosinophils Absolute: 0.1 10*3/uL (ref 0.0–0.7)
HEMATOCRIT: 37 % (ref 36.0–46.0)
Hemoglobin: 12.6 g/dL (ref 12.0–15.0)
LYMPHS PCT: 23.5 % (ref 12.0–46.0)
Lymphs Abs: 1.3 10*3/uL (ref 0.7–4.0)
MCHC: 34.1 g/dL (ref 30.0–36.0)
MCV: 93 fl (ref 78.0–100.0)
MONOS PCT: 7.4 % (ref 3.0–12.0)
Monocytes Absolute: 0.4 10*3/uL (ref 0.1–1.0)
NEUTROS ABS: 3.9 10*3/uL (ref 1.4–7.7)
Neutrophils Relative %: 67.2 % (ref 43.0–77.0)
PLATELETS: 284 10*3/uL (ref 150.0–400.0)
RBC: 3.98 Mil/uL (ref 3.87–5.11)
RDW: 12.3 % (ref 11.5–15.5)
WBC: 5.7 10*3/uL (ref 4.0–10.5)

## 2018-08-26 LAB — HEPATIC FUNCTION PANEL
ALT: 24 U/L (ref 0–35)
AST: 23 U/L (ref 0–37)
Albumin: 4.6 g/dL (ref 3.5–5.2)
Alkaline Phosphatase: 85 U/L (ref 39–117)
BILIRUBIN DIRECT: 0.1 mg/dL (ref 0.0–0.3)
BILIRUBIN TOTAL: 0.5 mg/dL (ref 0.2–1.2)
Total Protein: 7 g/dL (ref 6.0–8.3)

## 2018-08-26 LAB — URINALYSIS, ROUTINE W REFLEX MICROSCOPIC
Bilirubin Urine: NEGATIVE
Hgb urine dipstick: NEGATIVE
Ketones, ur: NEGATIVE
Nitrite: NEGATIVE
SPECIFIC GRAVITY, URINE: 1.01 (ref 1.000–1.030)
TOTAL PROTEIN, URINE-UPE24: NEGATIVE
URINE GLUCOSE: NEGATIVE
Urobilinogen, UA: 0.2 (ref 0.0–1.0)
pH: 6.5 (ref 5.0–8.0)

## 2018-08-26 LAB — BASIC METABOLIC PANEL
BUN: 21 mg/dL (ref 6–23)
CO2: 33 mEq/L — ABNORMAL HIGH (ref 19–32)
CREATININE: 0.77 mg/dL (ref 0.40–1.20)
Calcium: 9.7 mg/dL (ref 8.4–10.5)
Chloride: 101 mEq/L (ref 96–112)
GFR: 78.98 mL/min (ref >60.00)
GLUCOSE: 98 mg/dL (ref 70–99)
Potassium: 4.3 mEq/L (ref 3.5–5.1)
Sodium: 140 mEq/L (ref 135–145)

## 2018-08-26 MED ORDER — VITAMIN D3 50 MCG (2000 UT) PO CAPS: 2000.0000 [IU] | ORAL_CAPSULE | Freq: Every day | ORAL | 3 refills | Status: DC

## 2018-08-26 MED ORDER — DICLOFENAC SODIUM 75 MG PO TBEC: 75.0000 mg | DELAYED_RELEASE_TABLET | Freq: Two times a day (BID) | ORAL | 0 refills | Status: DC | PRN

## 2018-08-26 MED ORDER — AMOXICILLIN 500 MG PO CAPS: 2000.0000 mg | ORAL_CAPSULE | Freq: Every day | ORAL | 3 refills | Status: DC

## 2018-08-26 MED ORDER — ESCITALOPRAM OXALATE 5 MG PO TABS: 5.0000 mg | ORAL_TABLET | Freq: Every day | ORAL | 5 refills | Status: DC

## 2018-08-26 NOTE — Progress Notes (Signed)
Subjective:  Patient ID: Natalie Fletcher, female    DOB: 10-27-1950  Age: 67 y.o. MRN: [id number]  CC: No chief complaint on file.   HPI KEIRA BOHLIN presents for LBP x 2 wks after lifting a heavy boat C/o  stress at home - husband has ca - on a FT. Lost wt - stress    Outpatient Medications Prior to Visit  Medication Sig Dispense Refill  . amoxicillin (AMOXIL) 500 MG capsule Take 4 capsules (2,000 mg total) by mouth daily. 12 capsule 3  . pantoprazole (PROTONIX) 40 MG tablet TAKE 1 TABLET BY MOUTH EVERY DAY 30 MINUTES BEFORE A MEAL 90 tablet 3  . zaleplon (SONATA) 5 MG capsule TAKE 1-2 CAPSULES BY MOUTH AT BEDTIME AS NEEDED FOR SLEEP 60 capsule 3  . acyclovir (ZOVIRAX) 400 MG tablet TAKE 1 TABLET (400 MG TOTAL) BY MOUTH 3 (THREE) TIMES DAILY. (Patient not taking: Reported on 08/26/2018) 21 tablet 3  . buPROPion (WELLBUTRIN SR) 150 MG 12 hr tablet TAKE 1 TABLET BY MOUTH TWICE A DAY (Patient not taking: Reported on 08/26/2018) 60 tablet 5  . Calcium Carb-Cholecalciferol (CALCIUM 600 + D PO) Take 1 each by mouth daily.    . meloxicam (MOBIC) 7.5 MG tablet Take 1 tablet (7.5 mg total) by mouth daily as needed for pain. Yearly physical is due before refills will be given (Patient not taking: Reported on 08/26/2018) 30 tablet 0  . Multiple Vitamins-Minerals (MATURE ADULT CENTURY PO) Take by mouth daily.    . phentermine (ADIPEX-P) 37.5 MG tablet Take 1 tablet (37.5 mg total) by mouth daily before breakfast. 30 tablet 2   No facility-administered medications prior to visit.     ROS: Review of Systems  Constitutional: Negative for activity change, appetite change, chills, fatigue and unexpected weight change.  HENT: Negative for congestion, mouth sores and sinus pressure.   Eyes: Negative for visual disturbance.  Respiratory: Negative for cough and chest tightness.   Gastrointestinal: Negative for abdominal pain and nausea.  Genitourinary: Negative for difficulty urinating,  frequency and vaginal pain.  Musculoskeletal: Positive for back pain. Negative for gait problem.  Skin: Negative for pallor and rash.  Neurological: Negative for dizziness, tremors, weakness, numbness and headaches.  Psychiatric/Behavioral: Positive for dysphoric mood. Negative for confusion and sleep disturbance. The patient is nervous/anxious.     Objective:  BP 134/76 (BP Location: Left Arm, Patient Position: Sitting, Cuff Size: Normal)   Pulse 70   Temp 98.4 F (36.9 C) (Oral)   Ht 5\' 2"  (1.575 m)   Wt 154 lb (69.9 kg)   SpO2 98%   BMI 28.17 kg/m   BP Readings from Last 3 Encounters:  08/26/18 134/76  09/12/17 124/80  10/25/16 120/88    Wt Readings from Last 3 Encounters:  08/26/18 154 lb (69.9 kg)  09/12/17 171 lb (77.6 kg)  10/25/16 170 lb (77.1 kg)    Physical Exam  Constitutional: She appears well-developed. No distress.  HENT:  Head: Normocephalic.  Right Ear: External ear normal.  Left Ear: External ear normal.  Nose: Nose normal.  Mouth/Throat: Oropharynx is clear and moist.  Eyes: Pupils are equal, round, and reactive to light. Conjunctivae are normal. Right eye exhibits no discharge. Left eye exhibits no discharge.  Neck: Normal range of motion. Neck supple. No JVD present. No tracheal deviation present. No thyromegaly present.  Cardiovascular: Normal rate, regular rhythm and normal heart sounds.  Pulmonary/Chest: No stridor. No respiratory distress. She has no wheezes.  Abdominal:  Soft. Bowel sounds are normal. She exhibits no distension and no mass. There is no tenderness. There is no rebound and no guarding.  Musculoskeletal: She exhibits tenderness. She exhibits no edema.  Lymphadenopathy:    She has no cervical adenopathy.  Neurological: She displays normal reflexes. No cranial nerve deficit. She exhibits normal muscle tone. Coordination normal.  Skin: No rash noted. No erythema.  Psychiatric: She has a normal mood and affect. Her behavior is normal.  Judgment and thought content normal.  LS tenderin L1 area Str leg elev (-)   Lab Results  Component Value Date   WBC 6.3 01/06/2016   HGB 13.9 01/06/2016   HCT 41.1 01/06/2016   PLT 358.0 01/06/2016   GLUCOSE 113 (H) 06/27/2016   CHOL 244 (H) 01/06/2016   TRIG 71.0 01/06/2016   HDL 92.30 01/06/2016   LDLDIRECT 106.8 04/12/2012   LDLCALC 137 (H) 01/06/2016   ALT 17 06/27/2016   AST 21 06/27/2016   NA 142 06/27/2016   K 4.3 06/27/2016   CL 102 06/27/2016   CREATININE 0.85 06/27/2016   BUN 12 06/27/2016   CO2 33 (H) 06/27/2016   TSH 0.93 06/27/2016   INR 0.91 05/13/2012    Dg Knee Complete 4 Views Right  Result Date: 10/25/2016 CLINICAL DATA:  Right knee pain and swelling for 2 months without known injury. EXAM: RIGHT KNEE - COMPLETE 4+ VIEW COMPARISON:  None. FINDINGS: Status post right medial hemiarthroplasty. Femoral and tibial components appear to be well situated. No fracture, dislocation or joint effusion is noted. Minimal spurring of patella is noted. Mild spurring of tibial spines is noted. IMPRESSION: Status post right medial hemiarthroplasty. No acute abnormality seen in the right knee. Electronically Signed   By: Marijo Conception, M.D.   On: 10/25/2016 12:01    Assessment & Plan:   There are no diagnoses linked to this encounter.   No orders of the defined types were placed in this encounter.    Follow-up: No follow-ups on file.  Walker Kehr, MD

## 2018-08-26 NOTE — Assessment & Plan Note (Signed)
Lost wt due to stress

## 2018-08-26 NOTE — Assessment & Plan Note (Signed)
Protonix.  ?

## 2018-08-26 NOTE — Assessment & Plan Note (Signed)
Lexapro - low dose 

## 2018-08-26 NOTE — Assessment & Plan Note (Signed)
R/o compr fx X ray Diclofenac prn

## 2018-08-27 LAB — RHEUMATOID FACTOR: Rhuematoid fact SerPl-aCnc: <14 IU/mL (ref <14)

## 2018-09-11 ENCOUNTER — Encounter: Payer: Self-pay | Admitting: Internal Medicine

## 2018-09-11 ENCOUNTER — Ambulatory Visit (INDEPENDENT_AMBULATORY_CARE_PROVIDER_SITE_OTHER): Payer: BLUE CROSS/BLUE SHIELD | Admitting: Internal Medicine

## 2018-09-11 DIAGNOSIS — M5441 Lumbago with sciatica, right side: Secondary | ICD-10-CM

## 2018-09-11 DIAGNOSIS — F329 Major depressive disorder, single episode, unspecified: Secondary | ICD-10-CM | POA: Diagnosis not present

## 2018-09-11 DIAGNOSIS — M5442 Lumbago with sciatica, left side: Secondary | ICD-10-CM | POA: Diagnosis not present

## 2018-09-11 DIAGNOSIS — F32A Depression, unspecified: Secondary | ICD-10-CM

## 2018-09-11 DIAGNOSIS — R21 Rash and other nonspecific skin eruption: Secondary | ICD-10-CM | POA: Insufficient documentation

## 2018-09-11 MED ORDER — ESCITALOPRAM OXALATE 10 MG PO TABS: 10.0000 mg | ORAL_TABLET | Freq: Every day | ORAL | 5 refills | Status: DC

## 2018-09-11 MED ORDER — CLOBETASOL PROPIONATE 0.05 % EX SOLN: 1.0000 "application " | Freq: Two times a day (BID) | CUTANEOUS | 3 refills | Status: DC

## 2018-09-11 NOTE — Assessment & Plan Note (Signed)
Derm ref: eryth oval patch on scalp, no hair, no scaling 5x3 cm - ?lichen Rx: Clobetasol sol

## 2018-09-11 NOTE — Progress Notes (Signed)
Subjective:  Patient ID: Natalie Fletcher, female    DOB: 01/06/1951  Age: 67 y.o. MRN: [id number]  CC: No chief complaint on file.   HPI Natalie Fletcher presents for an itchy patch on scalp since summer F/u LBP - better F/u depression - not better  Outpatient Medications Prior to Visit  Medication Sig Dispense Refill  . amoxicillin (AMOXIL) 500 MG capsule Take 4 capsules (2,000 mg total) by mouth daily. 12 capsule 3  . Calcium Carb-Cholecalciferol (CALCIUM 600 + D PO) Take 1 each by mouth daily.    . Cholecalciferol (VITAMIN D3) 50 MCG (2000 UT) capsule Take 1 capsule (2,000 Units total) by mouth daily. 100 capsule 3  . diclofenac (VOLTAREN) 75 MG EC tablet Take 1 tablet (75 mg total) by mouth 2 (two) times daily as needed. 60 tablet 0  . escitalopram (LEXAPRO) 5 MG tablet Take 1 tablet (5 mg total) by mouth daily. 30 tablet 5  . Multiple Vitamins-Minerals (MATURE ADULT CENTURY PO) Take by mouth daily.    . pantoprazole (PROTONIX) 40 MG tablet TAKE 1 TABLET BY MOUTH EVERY DAY 30 MINUTES BEFORE A MEAL 90 tablet 3  . zaleplon (SONATA) 5 MG capsule TAKE 1-2 CAPSULES BY MOUTH AT BEDTIME AS NEEDED FOR SLEEP 60 capsule 3  . acyclovir (ZOVIRAX) 400 MG tablet TAKE 1 TABLET (400 MG TOTAL) BY MOUTH 3 (THREE) TIMES DAILY. (Patient not taking: Reported on 08/26/2018) 21 tablet 3   No facility-administered medications prior to visit.     ROS: Review of Systems  Constitutional: Negative.  Negative for activity change, appetite change, chills, diaphoresis, fatigue, fever and unexpected weight change.  HENT: Negative for congestion, ear pain, facial swelling, hearing loss, mouth sores, nosebleeds, postnasal drip, rhinorrhea, sinus pressure, sneezing, sore throat, tinnitus and trouble swallowing.   Eyes: Negative for pain, discharge, redness, itching and visual disturbance.  Respiratory: Negative for cough, chest tightness, shortness of breath, wheezing and stridor.   Cardiovascular: Negative for  chest pain, palpitations and leg swelling.  Gastrointestinal: Negative for abdominal distention, abdominal pain, anal bleeding, blood in stool, constipation, diarrhea, nausea and rectal pain.  Genitourinary: Negative for difficulty urinating, dysuria, flank pain, frequency, genital sores, hematuria, pelvic pain, urgency, vaginal bleeding, vaginal discharge and vaginal pain.  Musculoskeletal: Negative for arthralgias, back pain, gait problem, joint swelling, neck pain and neck stiffness.  Skin: Positive for color change and rash. Negative for pallor.  Neurological: Negative for dizziness, tremors, seizures, syncope, speech difficulty, weakness, numbness and headaches.  Hematological: Negative for adenopathy. Does not bruise/bleed easily.  Psychiatric/Behavioral: Negative for behavioral problems, confusion, decreased concentration, dysphoric mood, sleep disturbance and suicidal ideas. The patient is not nervous/anxious.     Objective:  BP 120/70 (BP Location: Left Arm, Patient Position: Sitting, Cuff Size: Normal)   Pulse (!) 59   Temp 98 F (36.7 C) (Oral)   Ht 5\' 2"  (1.575 m)   Wt 154 lb (69.9 kg)   SpO2 99%   BMI 28.17 kg/m   BP Readings from Last 3 Encounters:  09/11/18 120/70  08/26/18 134/76  09/12/17 124/80    Wt Readings from Last 3 Encounters:  09/11/18 154 lb (69.9 kg)  08/26/18 154 lb (69.9 kg)  09/12/17 171 lb (77.6 kg)    Physical Exam  Constitutional: She appears well-developed. No distress.  HENT:  Head: Normocephalic.  Right Ear: External ear normal.  Left Ear: External ear normal.  Nose: Nose normal.  Mouth/Throat: Oropharynx is clear and moist.  Eyes: Pupils  are equal, round, and reactive to light. Conjunctivae are normal. Right eye exhibits no discharge. Left eye exhibits no discharge.  Neck: Normal range of motion. Neck supple. No JVD present. No tracheal deviation present. No thyromegaly present.  Cardiovascular: Normal rate, regular rhythm and normal  heart sounds.  Pulmonary/Chest: No stridor. No respiratory distress. She has no wheezes.  Abdominal: Soft. Bowel sounds are normal. She exhibits no distension and no mass. There is no tenderness. There is no rebound and no guarding.  Musculoskeletal: She exhibits no edema or tenderness.  Lymphadenopathy:    She has no cervical adenopathy.  Neurological: She displays normal reflexes. No cranial nerve deficit. She exhibits normal muscle tone. Coordination normal.  Skin: Rash noted. There is erythema.  Psychiatric: She has a normal mood and affect. Her behavior is normal. Judgment and thought content normal.   eryth oval patch on scalp, no hair, no scaling 5x3 cm    Lab Results  Component Value Date   WBC 5.7 08/26/2018   HGB 12.6 08/26/2018   HCT 37.0 08/26/2018   PLT 284.0 08/26/2018   GLUCOSE 98 08/26/2018   CHOL 233 (H) 08/26/2018   TRIG 79.0 08/26/2018   HDL 73.20 08/26/2018   LDLDIRECT 106.8 04/12/2012   LDLCALC 144 (H) 08/26/2018   ALT 24 08/26/2018   AST 23 08/26/2018   NA 140 08/26/2018   K 4.3 08/26/2018   CL 101 08/26/2018   CREATININE 0.77 08/26/2018   BUN 21 08/26/2018   CO2 33 (H) 08/26/2018   TSH 1.29 08/26/2018   INR 0.91 05/13/2012    Dg Lumbar Spine 2-3 Views  Result Date: 08/26/2018 CLINICAL DATA:  Persistent low back pain for the past 2 weeks since lifting a heavy object. EXAM: LUMBAR SPINE - 2-3 VIEW COMPARISON:  Chest x-ray of May 13, 2012 for purposes of vertebral level labeling. FINDINGS: The twelfth ribs are small. There is sacralization of L5. The vertebral bodies are preserved in height. There is mild disc space narrowing at L3-4 and at L2-3. There is grade 1 anterolisthesis of L2 with respect L3 and L3 with respect L4. There is facet joint hypertrophy at L3-4 and at L4-5. 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 Mild disc space narrowing at L2-3 and L3-4 with grade 1 anterolisthesis of L2 with respect L3  and L3 with respect L4 likely on the basis of degenerative disc and facet joint change. No compression fracture. Electronically Signed   By: David  Martinique M.D.   On: 08/26/2018 15:31    Assessment & Plan:   There are no diagnoses linked to this encounter.   No orders of the defined types were placed in this encounter.    Follow-up: No follow-ups on file.  Walker Kehr, MD

## 2018-09-11 NOTE — Assessment & Plan Note (Signed)
Not better Lexapro - increase the dose

## 2018-09-11 NOTE — Assessment & Plan Note (Signed)
Better  

## 2018-09-22 ENCOUNTER — Other Ambulatory Visit: Payer: Self-pay | Admitting: Internal Medicine

## 2018-09-30 ENCOUNTER — Other Ambulatory Visit: Payer: Self-pay | Admitting: Internal Medicine

## 2018-10-23 ENCOUNTER — Other Ambulatory Visit: Payer: Self-pay | Admitting: Internal Medicine

## 2018-12-01 ENCOUNTER — Other Ambulatory Visit: Payer: Self-pay | Admitting: Internal Medicine

## 2018-12-12 ENCOUNTER — Other Ambulatory Visit: Payer: Self-pay | Admitting: Obstetrics and Gynecology

## 2018-12-12 DIAGNOSIS — R928 Other abnormal and inconclusive findings on diagnostic imaging of breast: Secondary | ICD-10-CM

## 2018-12-20 ENCOUNTER — Other Ambulatory Visit: Payer: Self-pay | Admitting: Internal Medicine

## 2018-12-20 MED ORDER — OSELTAMIVIR PHOSPHATE 75 MG PO CAPS: 75.0000 mg | ORAL_CAPSULE | Freq: Every day | ORAL | 0 refills | Status: DC

## 2018-12-20 NOTE — Telephone Encounter (Signed)
Patient advised.

## 2018-12-20 NOTE — Telephone Encounter (Signed)
Please advise in Dr. Plotnikovs absence 

## 2018-12-20 NOTE — Telephone Encounter (Signed)
Copied from [place] [id number]. Topic: Quick Communication - Rx Refill/Question >> Dec 20, 2018 10:11 AM Scherrie Gerlach wrote: Medication: tami flu  wife states her and her husband were directly exposed to the flu.  The husband's surgeon at [facility] recommend they call their pcp for tami flu. She is concerned as pt cannot get sick due to all his medical issues and she cannot either. [id number] [address], [place]. [phone] (Phone) [phone] (Fax)

## 2018-12-20 NOTE — Telephone Encounter (Signed)
Sent in preventative dosing 1 pill daily for 1 week.

## 2018-12-23 ENCOUNTER — Other Ambulatory Visit: Payer: Self-pay | Admitting: Obstetrics and Gynecology

## 2018-12-23 ENCOUNTER — Ambulatory Visit
Admission: RE | Admit: 2018-12-23 | Discharge: 2018-12-23 | Disposition: A | Discharge status: Home / Self Care | Payer: BLUE CROSS/BLUE SHIELD | Source: Ambulatory Visit | Attending: Obstetrics and Gynecology | Admitting: Obstetrics and Gynecology

## 2018-12-23 DIAGNOSIS — R928 Other abnormal and inconclusive findings on diagnostic imaging of breast: Secondary | ICD-10-CM

## 2019-01-27 ENCOUNTER — Other Ambulatory Visit: Payer: Self-pay | Admitting: Internal Medicine

## 2019-02-14 ENCOUNTER — Other Ambulatory Visit: Payer: Self-pay | Admitting: Internal Medicine

## 2019-02-23 ENCOUNTER — Other Ambulatory Visit: Payer: Self-pay | Admitting: Internal Medicine

## 2019-02-25 NOTE — Telephone Encounter (Signed)
Please advise about refill in Dr. Plotnikovs absence. 

## 2019-02-25 NOTE — Telephone Encounter (Signed)
Pt needs OV with Dr Alain Marion for further refills please

## 2019-03-03 ENCOUNTER — Telehealth: Payer: Self-pay

## 2019-03-03 ENCOUNTER — Other Ambulatory Visit: Payer: Self-pay

## 2019-03-03 MED ORDER — ACYCLOVIR 400 MG PO TABS: ORAL_TABLET | ORAL | 3 refills | Status: DC

## 2019-03-03 NOTE — Telephone Encounter (Signed)
Patient has called in and stated that in 2018, dr plotnikov prescribed acyclovir 400mg  tablet for lip, she gets this every once in awhile, and this really helped the last time---she has a little spot on her lip and is wanting to know if dr plotnikov would send in another rx---her husband is undergoing cancer tx and she is trying to stay away from places to keep him safe----[facility]acy [facility] [address]---routing to dr plotnikov, please advise, I will call patient back, thanks

## 2019-03-03 NOTE — Progress Notes (Signed)
Spoke with dr Alain Marion, ok to refill, rx sent to pharmacy---patient advised

## 2019-03-22 ENCOUNTER — Other Ambulatory Visit: Payer: Self-pay | Admitting: Internal Medicine

## 2019-03-26 ENCOUNTER — Other Ambulatory Visit: Payer: Self-pay | Admitting: Internal Medicine

## 2019-04-02 ENCOUNTER — Other Ambulatory Visit: Payer: Self-pay | Admitting: Internal Medicine

## 2019-04-24 ENCOUNTER — Other Ambulatory Visit: Payer: Self-pay | Admitting: Internal Medicine

## 2019-05-11 ENCOUNTER — Other Ambulatory Visit: Payer: Self-pay | Admitting: Internal Medicine

## 2019-06-03 ENCOUNTER — Other Ambulatory Visit: Payer: Self-pay | Admitting: Internal Medicine

## 2019-06-25 ENCOUNTER — Ambulatory Visit: Payer: BLUE CROSS/BLUE SHIELD | Admitting: Internal Medicine

## 2019-06-30 ENCOUNTER — Encounter: Payer: Self-pay | Admitting: Gastroenterology

## 2019-07-02 ENCOUNTER — Ambulatory Visit (INDEPENDENT_AMBULATORY_CARE_PROVIDER_SITE_OTHER): Payer: BC Managed Care – PPO | Admitting: Internal Medicine

## 2019-07-02 ENCOUNTER — Other Ambulatory Visit (INDEPENDENT_AMBULATORY_CARE_PROVIDER_SITE_OTHER): Payer: BC Managed Care – PPO

## 2019-07-02 ENCOUNTER — Encounter: Payer: Self-pay | Admitting: Internal Medicine

## 2019-07-02 ENCOUNTER — Other Ambulatory Visit: Payer: Self-pay

## 2019-07-02 VITALS — BP 130/82 | HR 62 | Temp 98.3°F | Ht 62.0 in | Wt 151.0 lb

## 2019-07-02 DIAGNOSIS — M79643 Pain in unspecified hand: Secondary | ICD-10-CM | POA: Diagnosis not present

## 2019-07-02 DIAGNOSIS — F329 Major depressive disorder, single episode, unspecified: Secondary | ICD-10-CM

## 2019-07-02 DIAGNOSIS — Z0000 Encounter for general adult medical examination without abnormal findings: Secondary | ICD-10-CM

## 2019-07-02 DIAGNOSIS — G4700 Insomnia, unspecified: Secondary | ICD-10-CM

## 2019-07-02 DIAGNOSIS — F32A Depression, unspecified: Secondary | ICD-10-CM

## 2019-07-02 DIAGNOSIS — E6609 Other obesity due to excess calories: Secondary | ICD-10-CM

## 2019-07-02 DIAGNOSIS — Z23 Encounter for immunization: Secondary | ICD-10-CM | POA: Diagnosis not present

## 2019-07-02 LAB — LIPID PANEL
Cholesterol: 219 mg/dL — ABNORMAL HIGH (ref 0–200)
HDL: 86.6 mg/dL (ref >39.00)
LDL Cholesterol: 118 mg/dL — ABNORMAL HIGH (ref 0–99)
NonHDL: 131.95
Total CHOL/HDL Ratio: 3
Triglycerides: 69 mg/dL (ref 0.0–149.0)
VLDL: 13.8 mg/dL (ref 0.0–40.0)

## 2019-07-02 LAB — URINALYSIS, ROUTINE W REFLEX MICROSCOPIC
Bilirubin Urine: NEGATIVE
Hgb urine dipstick: NEGATIVE
Ketones, ur: NEGATIVE
Nitrite: NEGATIVE
RBC / HPF: NONE SEEN (ref 0–?)
Specific Gravity, Urine: 1.01 (ref 1.000–1.030)
Total Protein, Urine: NEGATIVE
Urine Glucose: NEGATIVE
Urobilinogen, UA: 0.2 (ref 0.0–1.0)
pH: 6.5 (ref 5.0–8.0)

## 2019-07-02 LAB — BASIC METABOLIC PANEL
BUN: 12 mg/dL (ref 6–23)
CO2: 29 mEq/L (ref 19–32)
Calcium: 9.5 mg/dL (ref 8.4–10.5)
Chloride: 101 mEq/L (ref 96–112)
Creatinine, Ser: 0.79 mg/dL (ref 0.40–1.20)
GFR: 71.97 mL/min (ref >60.00)
Glucose, Bld: 98 mg/dL (ref 70–99)
Potassium: 3.8 mEq/L (ref 3.5–5.1)
Sodium: 140 mEq/L (ref 135–145)

## 2019-07-02 LAB — CBC WITH DIFFERENTIAL/PLATELET
Basophils Absolute: 0 10*3/uL (ref 0.0–0.1)
Basophils Relative: 1 % (ref 0.0–3.0)
Eosinophils Absolute: 0.1 10*3/uL (ref 0.0–0.7)
Eosinophils Relative: 1.4 % (ref 0.0–5.0)
HCT: 37.4 % (ref 36.0–46.0)
Hemoglobin: 12.3 g/dL (ref 12.0–15.0)
Lymphocytes Relative: 24.7 % (ref 12.0–46.0)
Lymphs Abs: 1.1 10*3/uL (ref 0.7–4.0)
MCHC: 32.9 g/dL (ref 30.0–36.0)
MCV: 90.4 fl (ref 78.0–100.0)
Monocytes Absolute: 0.4 10*3/uL (ref 0.1–1.0)
Monocytes Relative: 8.1 % (ref 3.0–12.0)
Neutro Abs: 3 10*3/uL (ref 1.4–7.7)
Neutrophils Relative %: 64.8 % (ref 43.0–77.0)
Platelets: 310 10*3/uL (ref 150.0–400.0)
RBC: 4.13 Mil/uL (ref 3.87–5.11)
RDW: 14.3 % (ref 11.5–15.5)
WBC: 4.6 10*3/uL (ref 4.0–10.5)

## 2019-07-02 LAB — HEPATIC FUNCTION PANEL
ALT: 13 U/L (ref 0–35)
AST: 19 U/L (ref 0–37)
Albumin: 4.3 g/dL (ref 3.5–5.2)
Alkaline Phosphatase: 69 U/L (ref 39–117)
Bilirubin, Direct: 0.1 mg/dL (ref 0.0–0.3)
Total Bilirubin: 0.5 mg/dL (ref 0.2–1.2)
Total Protein: 6.9 g/dL (ref 6.0–8.3)

## 2019-07-02 LAB — TSH: TSH: 1.3 u[IU]/mL (ref 0.35–4.50)

## 2019-07-02 MED ORDER — ESCITALOPRAM OXALATE 10 MG PO TABS: ORAL_TABLET | ORAL | 1 refills | Status: DC

## 2019-07-02 MED ORDER — ZALEPLON 5 MG PO CAPS: ORAL_CAPSULE | ORAL | 3 refills | Status: DC

## 2019-07-02 MED ORDER — PANTOPRAZOLE SODIUM 40 MG PO TBEC: DELAYED_RELEASE_TABLET | ORAL | 3 refills | Status: DC

## 2019-07-02 NOTE — Assessment & Plan Note (Signed)
Zaleplon prn   Potential benefits of a long term benzodiazepines  use as well as potential risks  and complications were explained to the patient and were aknowledged. 

## 2019-07-02 NOTE — Patient Instructions (Signed)
Cardiac CT calcium scoring test $150   Computed tomography, more commonly known as a CT or CAT scan, is a diagnostic medical imaging test. Like traditional x-rays, it produces multiple images or pictures of the inside of the body. The cross-sectional images generated during a CT scan can be reformatted in multiple planes. They can even generate three-dimensional images. These images can be viewed on a computer monitor, printed on film or by a 3D printer, or transferred to a CD or DVD. CT images of internal organs, bones, soft tissue and blood vessels provide greater detail than traditional x-rays, particularly of soft tissues and blood vessels. A cardiac CT scan for coronary calcium is a non-invasive way of obtaining information about the presence, location and extent of calcified plaque in the coronary arteries-the vessels that supply oxygen-containing blood to the heart muscle. Calcified plaque results when there is a build-up of fat and other substances under the inner layer of the artery. This material can calcify which signals the presence of atherosclerosis, a disease of the vessel wall, also called coronary artery disease (CAD). People with this disease have an increased risk for heart attacks. In addition, over time, progression of plaque build up (CAD) can narrow the arteries or even close off blood flow to the heart. The result may be chest pain, sometimes called "angina," or a heart attack. Because calcium is a marker of CAD, the amount of calcium detected on a cardiac CT scan is a helpful prognostic tool. The findings on cardiac CT are expressed as a calcium score. Another name for this test is coronary artery calcium scoring.  What are some common uses of the procedure? The goal of cardiac CT scan for calcium scoring is to determine if CAD is present and to what extent, even if there are no symptoms. It is a screening study that may be recommended by a physician for patients with risk factors  for CAD but no clinical symptoms. The major risk factors for CAD are: . high blood cholesterol levels  . family history of heart attacks  . diabetes  . high blood pressure  . cigarette smoking  . overweight or obese  . physical inactivity   A negative cardiac CT scan for calcium scoring shows no calcification within the coronary arteries. This suggests that CAD is absent or so minimal it cannot be seen by this technique. The chance of having a heart attack over the next two to five years is very low under these circumstances. A positive test means that CAD is present, regardless of whether or not the patient is experiencing any symptoms. The amount of calcification-expressed as the calcium score-may help to predict the likelihood of a myocardial infarction (heart attack) in the coming years and helps your medical doctor or cardiologist decide whether the patient may need to take preventive medicine or undertake other measures such as diet and exercise to lower the risk for heart attack. The extent of CAD is graded according to your calcium score:  Calcium Score  Presence of CAD (coronary artery disease)  0 No evidence of CAD   1-10 Minimal evidence of CAD  11-100 Mild evidence of CAD  101-400 Moderate evidence of CAD  Over 400 Extensive evidence of CAD     These suggestions will probably help you to improve your metabolism if you are not overweight and to lose weight if you are overweight: 1.  Reduce your consumption of sugars and starches.  Eliminate high fructose corn syrup from your   diet.  Reduce your consumption of processed foods.  For desserts try to have seasonal fruits, berries, nuts, cheeses or dark chocolate with more than 70% cacao. 2.  Do not snack 3.  You do not have to eat breakfast.  If you choose to have breakfast-eat plain greek yogurt, eggs, oatmeal (without sugar) 4.  Drink water, freshly brewed unsweetened tea (green, black or herbal) or coffee.  Do not drink sodas  including diet sodas , juices, beverages sweetened with artificial sweeteners. 5.  Reduce your consumption of refined grains. 6.  Avoid protein drinks such as Optifast, Slim fast etc. Eat chicken, fish, meat, dairy and beans for your sources of protein 7.  Natural unprocessed fats like cold pressed virgin olive oil, butter, coconut oil are good for you.  Eat avocados 8.  Increase your consumption of fiber.  Fruits, berries, vegetables, whole grains, flaxseeds, Chia seeds, beans, popcorn, nuts, oatmeal are good sources of fiber 9.  Use vinegar in your diet, i.e. apple cider vinegar, red wine or balsamic vinegar 10.  You can try fasting.  For example you can skip breakfast and lunch every other day (24-hour fast) 11.  Stress reduction, good night sleep, relaxation, meditation, yoga and other physical activity is likely to help you to maintain low weight too. 12.  If you drink alcohol, limit your alcohol intake to no more than 2 drinks a day.   Mediterranean diet is good for you. ([employer] has a typical Mediterranean cuisine menu) The Mediterranean diet is a way of eating based on the traditional cuisine of countries bordering the [place] Sea. While there is no single definition of the Mediterranean diet, it is typically high in vegetables, fruits, whole grains, beans, nut and seeds, and olive oil. The main components of Mediterranean diet include: . Daily consumption of vegetables, fruits, whole grains and healthy fats  . Weekly intake of fish, poultry, beans and eggs  . Moderate portions of dairy products  . Limited intake of red meat Other important elements of the Mediterranean diet are sharing meals with family and friends, enjoying a glass of red wine and being physically active. Health benefits of a Mediterranean diet: A traditional Mediterranean diet consisting of large quantities of fresh fruits and vegetables, nuts, fish and olive oil-coupled with physical activity-can reduce  your risk of serious mental and physical health problems by: Preventing heart disease and strokes. Following a Mediterranean diet limits your intake of refined breads, processed foods, and red meat, and encourages drinking red wine instead of hard liquor-all factors that can help prevent heart disease and stroke. Keeping you agile. If you're an older adult, the nutrients gained with a Mediterranean diet may reduce your risk of developing muscle weakness and other signs of frailty by about 70 percent. Reducing the risk of Alzheimer's. Research suggests that the Mediterranean diet may improve cholesterol, blood sugar levels, and overall blood vessel health, which in turn may reduce your risk of Alzheimer's disease or dementia. Halving the risk of Parkinson's disease. The high levels of antioxidants in the Mediterranean diet can prevent cells from undergoing a damaging process called oxidative stress, thereby cutting the risk of Parkinson's disease in half. Increasing longevity. By reducing your risk of developing heart disease or cancer with the Mediterranean diet, you're reducing your risk of death at any age by 20%. Protecting against type 2 diabetes. A Mediterranean diet is rich in fiber which digests slowly, prevents huge swings in blood sugar, and can help you maintain a healthy   weight.    Cabbage soup recipe that will not make you gain weight: Take 1 small head of cabbage, 1 average pack of celery, 4 green peppers, 4 onions, 2 cans diced tomatoes (they are not available without salt), salt and spices to taste.  Chop cabbage, celery, peppers and onions.  And tomatoes and 2-2.5 liters (2.5 quarts) of water so that it would just cover the vegetables.  Bring to boil.  Add spices and salt.  Turn heat to low/medium and simmer for 20-25 minutes.  Naturally, you can make a smaller batch and change some of the ingredients.  

## 2019-07-02 NOTE — Assessment & Plan Note (Signed)
Diet discussed  Wt Readings from Last 3 Encounters:  07/02/19 151 lb (68.5 kg)  09/11/18 154 lb (69.9 kg)  08/26/18 154 lb (69.9 kg)

## 2019-07-02 NOTE — Assessment & Plan Note (Signed)
Labs Lexapro

## 2019-07-02 NOTE — Progress Notes (Signed)
Subjective:  Patient ID: Natalie Fletcher, female    DOB: 15-Jan-1951  Age: 68 y.o. MRN: [id number]  CC: No chief complaint on file.   HPI Natalie Fletcher presents for a well exam F/u  insomnia, anxiety, stress Not taking Zaleplon, not sleeping F/u GERD  Outpatient Medications Prior to Visit  Medication Sig Dispense Refill   Calcium Carb-Cholecalciferol (CALCIUM 600 + D PO) Take 1 each by mouth daily.     Cholecalciferol (VITAMIN D3) 50 MCG (2000 UT) capsule Take 1 capsule (2,000 Units total) by mouth daily. 100 capsule 3   clobetasol (TEMOVATE) 0.05 % external solution Apply 1 application topically 2 (two) times daily. 50 mL 3   diclofenac (VOLTAREN) 75 MG EC tablet Take 1 tablet (75 mg total) by mouth 2 (two) times daily. Follow-up appt is due must see provider for future refills 60 tablet 0   escitalopram (LEXAPRO) 10 MG tablet TAKE 1 TABLET BY MOUTH DAILY. FOLLOW-UP APPT IS DUE MUST SEE PROVIDER FOR FUTURE REFILLS 30 tablet 5   Multiple Vitamins-Minerals (MATURE ADULT CENTURY PO) Take by mouth daily.     pantoprazole (PROTONIX) 40 MG tablet TAKE 1 TABLET BY MOUTH EVERY DAY 30 MINUTES BEFORE A MEAL 90 tablet 0   zaleplon (SONATA) 5 MG capsule TAKE 1-2 CAPSULES BY MOUTH AT BEDTIME AS NEEDED FOR SLEEP 60 capsule 3   acyclovir (ZOVIRAX) 400 MG tablet TAKE 1 TABLET (400 MG TOTAL) BY MOUTH 3 (THREE) TIMES DAILY. 21 tablet 3   amoxicillin (AMOXIL) 500 MG capsule TAKE 4 CAPSULES BY MOUTH EVERY DAY 12 capsule 3   oseltamivir (TAMIFLU) 75 MG capsule Take 1 capsule (75 mg total) by mouth daily. 7 capsule 0   No facility-administered medications prior to visit.     ROS: Review of Systems  Constitutional: Negative for activity change, appetite change, chills, fatigue and unexpected weight change.  HENT: Negative for congestion, mouth sores and sinus pressure.   Eyes: Negative for visual disturbance.  Respiratory: Negative for cough and chest tightness.   Gastrointestinal: Negative  for abdominal pain and nausea.  Genitourinary: Negative for difficulty urinating, frequency and vaginal pain.  Musculoskeletal: Negative for back pain and gait problem.  Skin: Negative for pallor and rash.  Neurological: Negative for dizziness, tremors, weakness, numbness and headaches.  Psychiatric/Behavioral: Negative for confusion and sleep disturbance.    Objective:  BP 130/82 (BP Location: Left Arm, Patient Position: Sitting, Cuff Size: Normal)    Pulse 62    Temp 98.3 F (36.8 C) (Oral)    Ht 5\' 2"  (1.575 m)    Wt 151 lb (68.5 kg)    SpO2 98%    BMI 27.62 kg/m   BP Readings from Last 3 Encounters:  07/02/19 130/82  09/11/18 120/70  08/26/18 134/76    Wt Readings from Last 3 Encounters:  07/02/19 151 lb (68.5 kg)  09/11/18 154 lb (69.9 kg)  08/26/18 154 lb (69.9 kg)    Physical Exam Constitutional:      General: She is not in acute distress.    Appearance: She is well-developed.  HENT:     Head: Normocephalic.     Right Ear: External ear normal.     Left Ear: External ear normal.     Nose: Nose normal.  Eyes:     General:        Right eye: No discharge.        Left eye: No discharge.     Conjunctiva/sclera: Conjunctivae normal.  Pupils: Pupils are equal, round, and reactive to light.  Neck:     Musculoskeletal: Normal range of motion and neck supple.     Thyroid: No thyromegaly.     Vascular: No JVD.     Trachea: No tracheal deviation.  Cardiovascular:     Rate and Rhythm: Normal rate and regular rhythm.     Heart sounds: Normal heart sounds.  Pulmonary:     Effort: No respiratory distress.     Breath sounds: No stridor. No wheezing.  Abdominal:     General: Bowel sounds are normal. There is no distension.     Palpations: Abdomen is soft. There is no mass.     Tenderness: There is no abdominal tenderness. There is no guarding or rebound.  Musculoskeletal:        General: No tenderness.  Lymphadenopathy:     Cervical: No cervical adenopathy.  Skin:     Findings: No erythema or rash.  Neurological:     Cranial Nerves: No cranial nerve deficit.     Motor: No abnormal muscle tone.     Coordination: Coordination normal.     Deep Tendon Reflexes: Reflexes normal.  Psychiatric:        Behavior: Behavior normal.        Thought Content: Thought content normal.        Judgment: Judgment normal.    R 3d trigger finger  Lab Results  Component Value Date   WBC 5.7 08/26/2018   HGB 12.6 08/26/2018   HCT 37.0 08/26/2018   PLT 284.0 08/26/2018   GLUCOSE 98 08/26/2018   CHOL 233 (H) 08/26/2018   TRIG 79.0 08/26/2018   HDL 73.20 08/26/2018   LDLDIRECT 106.8 04/12/2012   LDLCALC 144 (H) 08/26/2018   ALT 24 08/26/2018   AST 23 08/26/2018   NA 140 08/26/2018   K 4.3 08/26/2018   CL 101 08/26/2018   CREATININE 0.77 08/26/2018   BUN 21 08/26/2018   CO2 33 (H) 08/26/2018   TSH 1.29 08/26/2018   INR 0.91 05/13/2012    US Breast Ltd Uni Right Inc Axilla  Result Date: 12/23/2018 CLINICAL DATA:  Possible asymmetry in the superior aspect of the right breast in the oblique implant displaced projection on a recent screening mammogram. The patient has retroglandular implants. EXAM: DIGITAL DIAGNOSTIC RIGHT MAMMOGRAM WITH CAD AND TOMO ULTRASOUND RIGHT BREAST COMPARISON:  Previous exam(s). ACR Breast Density Category b: There are scattered areas of fibroglandular density. FINDINGS: 3D tomographic and 2D generated implant displaced true lateral and implant displaced spot compression oblique views of the right breast were obtained. On the implant displaced spot compression oblique views, there is a 1.7 x 0.9 cm asymmetry with some circumscribed and some irregular and indistinct margins. This has interspersed fat density and has an appearance similar to fibroglandular tissue elsewhere in the right breast. This also has an appearance similar to an implant displaced oblique view dated 11/01/2010. No corresponding density is seen in the true lateral projection  today or in the craniocaudal projection on the recent screening mammogram. Mammographic images were processed with CAD. On physical exam, no mass is palpable in the upper right breast. Targeted ultrasound is performed, showing normal appearing breast tissue throughout the upper right breast, including islands of normal fibroglandular tissue. No masses or other findings suspicious for malignancy were seen. IMPRESSION: No evidence of malignancy. The recently suspected right breast asymmetry is normal fibroglandular tissue. RECOMMENDATION: Bilateral screening mammogram in 1 year. I have discussed the findings  and recommendations with the patient. Results were also provided in writing at the conclusion of the visit. If applicable, a reminder letter will be sent to the patient regarding the next appointment. BI-RADS CATEGORY  1: Negative. Electronically Signed   By: Claudie Revering M.D.   On: 12/23/2018 10:26   Mm Diag Breast Tomo Uni Right  Result Date: 12/23/2018 CLINICAL DATA:  Possible asymmetry in the superior aspect of the right breast in the oblique implant displaced projection on a recent screening mammogram. The patient has retroglandular implants. EXAM: DIGITAL DIAGNOSTIC RIGHT MAMMOGRAM WITH CAD AND TOMO ULTRASOUND RIGHT BREAST COMPARISON:  Previous exam(s). ACR Breast Density Category b: There are scattered areas of fibroglandular density. FINDINGS: 3D tomographic and 2D generated implant displaced true lateral and implant displaced spot compression oblique views of the right breast were obtained. On the implant displaced spot compression oblique views, there is a 1.7 x 0.9 cm asymmetry with some circumscribed and some irregular and indistinct margins. This has interspersed fat density and has an appearance similar to fibroglandular tissue elsewhere in the right breast. This also has an appearance similar to an implant displaced oblique view dated 11/01/2010. No corresponding density is seen in the true  lateral projection today or in the craniocaudal projection on the recent screening mammogram. Mammographic images were processed with CAD. On physical exam, no mass is palpable in the upper right breast. Targeted ultrasound is performed, showing normal appearing breast tissue throughout the upper right breast, including islands of normal fibroglandular tissue. No masses or other findings suspicious for malignancy were seen. IMPRESSION: No evidence of malignancy. The recently suspected right breast asymmetry is normal fibroglandular tissue. RECOMMENDATION: Bilateral screening mammogram in 1 year. I have discussed the findings and recommendations with the patient. Results were also provided in writing at the conclusion of the visit. If applicable, a reminder letter will be sent to the patient regarding the next appointment. BI-RADS CATEGORY  1: Negative. Electronically Signed   By: Claudie Revering M.D.   On: 12/23/2018 10:26    Assessment & Plan:   Diagnoses and all orders for this visit:  Need for influenza vaccination -     Flu Vaccine QUAD High Dose(Fluad)     No orders of the defined types were placed in this encounter.    Follow-up: No follow-ups on file.  Walker Kehr, MD

## 2019-07-02 NOTE — Assessment & Plan Note (Signed)
We can inject trigger finger L

## 2019-07-15 ENCOUNTER — Ambulatory Visit (AMBULATORY_SURGERY_CENTER): Payer: Self-pay | Admitting: *Deleted

## 2019-07-15 ENCOUNTER — Encounter: Payer: Self-pay | Admitting: Gastroenterology

## 2019-07-15 ENCOUNTER — Other Ambulatory Visit: Payer: Self-pay

## 2019-07-15 VITALS — Ht 62.0 in | Wt 144.2 lb

## 2019-07-15 DIAGNOSIS — Z1211 Encounter for screening for malignant neoplasm of colon: Secondary | ICD-10-CM

## 2019-07-15 MED ORDER — SUPREP BOWEL PREP KIT 17.5-3.13-1.6 GM/177ML PO SOLN: 1.0000 | Freq: Once | ORAL | 0 refills | Status: AC

## 2019-07-15 NOTE — Progress Notes (Signed)
Patient denies any allergies to egg or soy products. Patient denies complications with anesthesia/sedation.  Patient denies oxygen use at home and denies diet medications. Emmi instructions for colonoscopy explained and given to patient.  

## 2019-07-28 ENCOUNTER — Telehealth: Payer: Self-pay

## 2019-07-28 NOTE — Telephone Encounter (Signed)
Pt responded "no" to all screening questions °

## 2019-07-28 NOTE — Telephone Encounter (Signed)
Covid-19 screening questions   Do you now or have you had a fever in the last 14 days?  Do you have any respiratory symptoms of shortness of breath or cough now or in the last 14 days?  Do you have any family members or close contacts with diagnosed or suspected Covid-19 in the past 14 days?  Have you been tested for Covid-19 and found to be positive?       

## 2019-07-29 ENCOUNTER — Encounter: Payer: Self-pay | Admitting: Gastroenterology

## 2019-07-29 ENCOUNTER — Ambulatory Visit (AMBULATORY_SURGERY_CENTER): Payer: BC Managed Care – PPO | Admitting: Gastroenterology

## 2019-07-29 ENCOUNTER — Other Ambulatory Visit: Payer: Self-pay

## 2019-07-29 ENCOUNTER — Other Ambulatory Visit: Payer: Self-pay | Admitting: Gastroenterology

## 2019-07-29 VITALS — BP 138/79 | HR 50 | Temp 98.3°F | Resp 10 | Ht 62.0 in | Wt 144.2 lb

## 2019-07-29 DIAGNOSIS — D123 Benign neoplasm of transverse colon: Secondary | ICD-10-CM

## 2019-07-29 DIAGNOSIS — Z1211 Encounter for screening for malignant neoplasm of colon: Secondary | ICD-10-CM

## 2019-07-29 DIAGNOSIS — D120 Benign neoplasm of cecum: Secondary | ICD-10-CM | POA: Diagnosis not present

## 2019-07-29 MED ORDER — SODIUM CHLORIDE 0.9 % IV SOLN: 500.0000 mL | Freq: Once | INTRAVENOUS | Status: DC

## 2019-07-29 NOTE — Patient Instructions (Signed)
Handouts provided on:  Polyps and Diverticulosis  The polyps removed are being sent off for pathology and it takes 10-14 days usually get the results.  You may resume your previous diet and medication schedule  YOU HAD AN ENDOSCOPIC PROCEDURE TODAY AT [facility]:   Refer to the procedure report that was given to you for any specific questions about what was found during the examination.  If the procedure report does not answer your questions, please call your gastroenterologist to clarify.  If you requested that your care partner not be given the details of your procedure findings, then the procedure report has been included in a sealed envelope for you to review at your convenience later.  YOU SHOULD EXPECT: Some feelings of bloating in the abdomen. Passage of more gas than usual.  Walking can help get rid of the air that was put into your GI tract during the procedure and reduce the bloating. If you had a lower endoscopy (such as a colonoscopy or flexible sigmoidoscopy) you may notice spotting of blood in your stool or on the toilet paper. If you underwent a bowel prep for your procedure, you may not have a normal bowel movement for a few days.  Please Note:  You might notice some irritation and congestion in your nose or some drainage.  This is from the oxygen used during your procedure.  There is no need for concern and it should clear up in a day or so.  SYMPTOMS TO REPORT IMMEDIATELY:   Following lower endoscopy (colonoscopy or flexible sigmoidoscopy):  Excessive amounts of blood in the stool  Significant tenderness or worsening of abdominal pains  Swelling of the abdomen that is new, acute  Fever of 100F or higher   For urgent or emergent issues, a gastroenterologist can be reached at any hour by calling [phone].   DIET:  We do recommend a small meal at first, but then you may proceed to your regular diet.  Drink plenty of fluids but you should avoid  alcoholic beverages for 24 hours.  ACTIVITY:  You should plan to take it easy for the rest of today and you should NOT DRIVE or use heavy machinery until tomorrow (because of the sedation medicines used during the test).    FOLLOW UP: Our staff will call the number listed on your records 48-72 hours following your procedure to check on you and address any questions or concerns that you may have regarding the information given to you following your procedure. If we do not reach you, we will leave a message.  We will attempt to reach you two times.  During this call, we will ask if you have developed any symptoms of COVID 19. If you develop any symptoms (ie: fever, flu-like symptoms, shortness of breath, cough etc.) before then, please call [phone].  If you test positive for Covid 19 in the 2 weeks post procedure, please call and report this information to [place].    If any biopsies were taken you will be contacted by phone or by letter within the next 1-3 weeks.  Please call us at [phone] if you have not heard about the biopsies in 3 weeks.    SIGNATURES/CONFIDENTIALITY: You and/or your care partner have signed paperwork which will be entered into your electronic medical record.  These signatures attest to the fact that that the information above on your After Visit Summary has been reviewed and is understood.  Full responsibility of the confidentiality  of this discharge information lies with you and/or your care-partner.

## 2019-07-29 NOTE — Op Note (Signed)
[facility] Patient Name: Natalie Fletcher Procedure Date: 07/29/2019 7:32 AM MRN: [id number] Endoscopist: Remo Lipps P. Havery Moros , MD Age: 68 Referring MD:  Date of Birth: 08/12/1951 Gender: Female Account #: [id number] Procedure:                Colonoscopy Indications:              Screening for colorectal malignant neoplasm Medicines:                Monitored Anesthesia Care Procedure:                Pre-Anesthesia Assessment:                           - Prior to the procedure, a History and Physical                            was performed, and patient medications and                            allergies were reviewed. The patient's tolerance of                            previous anesthesia was also reviewed. The risks                            and benefits of the procedure and the sedation                            options and risks were discussed with the patient.                            All questions were answered, and informed consent                            was obtained. Prior Anticoagulants: The patient has                            taken no previous anticoagulant or antiplatelet                            agents. ASA Grade Assessment: II - A patient with                            mild systemic disease. After reviewing the risks                            and benefits, the patient was deemed in                            satisfactory condition to undergo the procedure.                           After obtaining informed consent, the colonoscope  was passed under direct vision. Throughout the                            procedure, the patient's blood pressure, pulse, and                            oxygen saturations were monitored continuously. The                            Colonoscope was introduced through the anus and                            advanced to the the cecum, identified by                            appendiceal orifice  and ileocecal valve. The                            colonoscopy was performed without difficulty. The                            patient tolerated the procedure well. The quality                            of the bowel preparation was adequate. The                            ileocecal valve, appendiceal orifice, and rectum                            were photographed. Scope In: 8:01:38 AM Scope Out: 8:29:16 AM Scope Withdrawal Time: 0 hours 23 minutes 9 seconds  Total Procedure Duration: 0 hours 27 minutes 38 seconds  Findings:                 The perianal and digital rectal examinations were                            normal.                           A diminutive polyp was found in the cecum. The                            polyp was sessile. The polyp was removed with a                            cold snare. Resection and retrieval were complete.                           A 3 to 4 mm polyp was found in the hepatic flexure.                            The polyp was sessile. The polyp was removed with a  cold snare. Resection and retrieval were complete.                           A diminutive polyp was found in the transverse                            colon. The polyp was sessile. The polyp was removed                            with a cold snare. Resection and retrieval were                            complete.                           Multiple medium-mouthed diverticula were found in                            the sigmoid colon.                           Severe melanosis was found in the entire colon.                           The colon was tortous. Residual seeds were noted                            throughout the colon, time was taken to lavage                            these areas and avoid clogging the scope. The exam                            was otherwise without abnormality. Complications:            No immediate complications. Estimated blood loss:                             Minimal. Estimated Blood Loss:     Estimated blood loss was minimal. Impression:               - One diminutive polyp in the cecum, removed with a                            cold snare. Resected and retrieved.                           - One 3 to 4 mm polyp at the hepatic flexure,                            removed with a cold snare. Resected and retrieved.                           - One diminutive polyp in the transverse colon,  removed with a cold snare. Resected and retrieved.                           - Diverticulosis in the sigmoid colon.                           - Melanosis coli                           - Tortous colon.                           - The examination was otherwise normal. Recommendation:           - Patient has a contact number available for                            emergencies. The signs and symptoms of potential                            delayed complications were discussed with the                            patient. Return to normal activities tomorrow.                            Written discharge instructions were provided to the                            patient.                           - Resume previous diet.                           - Continue present medications.                           - Await pathology results. Remo Lipps P. Jaye Saal, MD 07/29/2019 8:34:37 AM This report has been signed electronically.

## 2019-07-29 NOTE — Progress Notes (Signed)
Called to room to assist during endoscopic procedure.  Patient ID and intended procedure confirmed with present staff. Received instructions for my participation in the procedure from the performing physician.  

## 2019-07-29 NOTE — Progress Notes (Signed)
PT taken to PACU. Monitors in place. VSS. Report given to RN. 

## 2019-07-29 NOTE — Progress Notes (Signed)
Pt's states no medical or surgical changes since previsit or office visit. 

## 2019-07-31 ENCOUNTER — Telehealth: Payer: Self-pay | Admitting: *Deleted

## 2019-07-31 NOTE — Telephone Encounter (Signed)
  Follow up Call-  Call back number 07/29/2019  Post procedure Call Back phone  # [phone]  Permission to leave phone message Yes  Some recent data might be hidden     Patient questions:  Do you have a fever, pain , or abdominal swelling? No. Pain Score  0 *  Have you tolerated food without any problems? Yes.    Have you been able to return to your normal activities? Yes.    Do you have any questions about your discharge instructions: Diet   No. Medications  No. Follow up visit  No.  Do you have questions or concerns about your Care? No.  Actions: * If pain score is 4 or above: No action needed, pain <4.  1. Have you developed a fever since your procedure? no  2.   Have you had an respiratory symptoms (SOB or cough) since your procedure? no  3.   Have you tested positive for COVID 19 since your procedure no  4.   Have you had any family members/close contacts diagnosed with the COVID 19 since your procedure?  no   If yes to any of these questions please route to Joylene John, RN and Alphonsa Gin, Therapist, sports.

## 2019-08-28 ENCOUNTER — Other Ambulatory Visit: Payer: Self-pay | Admitting: Internal Medicine

## 2019-11-12 ENCOUNTER — Ambulatory Visit: Payer: BC Managed Care – PPO

## 2019-11-21 ENCOUNTER — Ambulatory Visit: Payer: BC Managed Care – PPO | Attending: Internal Medicine

## 2019-11-21 DIAGNOSIS — Z23 Encounter for immunization: Secondary | ICD-10-CM | POA: Insufficient documentation

## 2019-11-21 NOTE — Progress Notes (Signed)
   Covid-19 Vaccination Clinic  Name:  Natalie Fletcher    MRN: [id number] DOB: 11/27/1950  11/21/2019  [name] was observed post Covid-19 immunization for 15 minutes without incidence. She was provided with Vaccine Information Sheet and instruction to access the V-Safe system.   [name] was instructed to call 911 with any severe reactions post vaccine: Marland Kitchen Difficulty breathing  . Swelling of your face and throat  . A fast heartbeat  . A bad rash all over your body  . Dizziness and weakness    Immunizations Administered    Name Date Dose VIS Date Route   Pfizer COVID-19 Vaccine 11/21/2019  9:25 AM 0.3 mL 09/26/2019 Intramuscular   Manufacturer: [place]   Lot: [id number]   [place]: [id number]

## 2019-12-03 ENCOUNTER — Ambulatory Visit: Payer: BC Managed Care – PPO

## 2019-12-16 ENCOUNTER — Ambulatory Visit: Payer: BC Managed Care – PPO | Attending: Internal Medicine

## 2019-12-16 DIAGNOSIS — Z23 Encounter for immunization: Secondary | ICD-10-CM | POA: Insufficient documentation

## 2019-12-16 NOTE — Progress Notes (Signed)
   Covid-19 Vaccination Clinic  Name:  ARIEN BLACKSTOCK    MRN: [id number] DOB: 1950/12/25  12/16/2019  Ms. Gladue was observed post Covid-19 immunization for 15 minutes without incident. She was provided with Vaccine Information Sheet and instruction to access the V-Safe system.   Ms. Nudd was instructed to call 911 with any severe reactions post vaccine: Marland Kitchen Difficulty breathing  . Swelling of face and throat  . A fast heartbeat  . A bad rash all over body  . Dizziness and weakness   Immunizations Administered    Name Date Dose VIS Date Route   Pfizer COVID-19 Vaccine 12/16/2019  8:44 AM 0.3 mL 09/26/2019 Intramuscular   Manufacturer: [place]   Lot: [id number]   [place]: [id number]

## 2020-02-18 ENCOUNTER — Ambulatory Visit: Payer: BC Managed Care – PPO | Admitting: Internal Medicine

## 2020-02-24 ENCOUNTER — Other Ambulatory Visit: Payer: Self-pay

## 2020-02-24 ENCOUNTER — Encounter: Payer: Self-pay | Admitting: Internal Medicine

## 2020-02-24 ENCOUNTER — Ambulatory Visit (INDEPENDENT_AMBULATORY_CARE_PROVIDER_SITE_OTHER): Payer: Medicare Other | Admitting: Internal Medicine

## 2020-02-24 DIAGNOSIS — M778 Other enthesopathies, not elsewhere classified: Secondary | ICD-10-CM

## 2020-02-24 DIAGNOSIS — M79643 Pain in unspecified hand: Secondary | ICD-10-CM | POA: Diagnosis not present

## 2020-02-24 MED ORDER — METHYLPREDNISOLONE 4 MG PO TBPK: ORAL_TABLET | ORAL | 0 refills | Status: DC

## 2020-02-24 NOTE — Patient Instructions (Signed)
Tendinitis  Tendinitis is inflammation of a tendon. A tendon is a strong cord of tissue that connects muscle to bone. Tendinitis can affect any tendon, but it most commonly affects the:  Shoulder tendon (rotator cuff).  Ankle tendon (Achilles tendon).  Elbow tendon (triceps tendon).  Tendons in the wrist. What are the causes? This condition may be caused by:  Overusing a tendon or muscle. This is common.  Age-related wear and tear.  Injury.  Inflammatory conditions, such as arthritis.  Certain medicines. What increases the risk? You are more likely to develop this condition if you do activities that involve the same movements over and over again (repetitive motions). What are the signs or symptoms? Symptoms of this condition may include:  Pain.  Tenderness.  Mild swelling.  Decreased range of motion. How is this diagnosed? This condition is diagnosed with a physical exam. You may also have tests, such as:  Ultrasound. This uses sound waves to make an image of the inside of your body in the affected area.  MRI. How is this treated? This condition may be treated by resting, icing, applying pressure (compression), and raising (elevating) the affected area above the level of your heart. This is known as RICE therapy. Treatment may also include:  Medicines to help reduce inflammation or to help reduce pain.  Exercises or physical therapy to strengthen and stretch the tendon.  A brace or splint.  Surgery. This is rarely needed. Follow these instructions at home: If you have a splint or brace:  Wear the splint or brace as told by your health care provider. Remove it only as told by your health care provider.  Loosen the splint or brace if your fingers or toes tingle, become numb, or turn cold and blue.  Keep the splint or brace clean.  If the splint or brace is not waterproof: ? Do not let it get wet. ? Cover it with a watertight covering when you take a bath  or shower. Managing pain, stiffness, and swelling  If directed, put ice on the affected area. ? If you have a removable splint or brace, remove it as told by your health care provider. ? Put ice in a plastic bag. ? Place a towel between your skin and the bag. ? Leave the ice on for 20 minutes, 2-3 times a day.  Move the fingers or toes of the affected limb often, if this applies. This can help to prevent stiffness and lessen swelling.  If directed, raise (elevate) the affected area above the level of your heart while you are sitting or lying down.  If directed, apply heat to the affected area before you exercise. Use the heat source that your health care provider recommends, such as a moist heat pack or a heating pad.     ? Place a towel between your skin and the heat source. ? Leave the heat on for 20-30 minutes. ? Remove the heat if your skin turns bright red. This is especially important if you are unable to feel pain, heat, or cold. You may have a greater risk of getting burned. Driving  Do not drive or use heavy machinery while taking prescription pain medicine.  Ask your health care provider when it is safe to drive if you have a splint or brace on any part of your arm or leg. Activity  Rest the affected area as told by your health care provider.  Return to your normal activities as told by your health care   provider. Ask your health care provider what activities are safe for you.  Avoid using the affected area while you are experiencing symptoms of tendinitis.  Do exercises as told by your health care provider. General instructions  If you have a splint, do not put pressure on any part of the splint until it is fully hardened. This may take several hours.  Wear an elastic bandage or compression wrap only as told by your health care provider.  Take over-the-counter and prescription medicines only as told by your health care provider.  Keep all follow-up visits as told  by your health care provider. This is important. Contact a health care provider if:  Your symptoms do not improve.  You develop new, unexplained problems, such as numbness in your hands. Summary  Tendinitis is inflammation of a tendon.  You are more likely to develop this condition if you do activities that involve the same movements over and over again.  This condition may be treated by resting, icing, applying pressure (compression), and elevating the area above the level of your heart. This is known as RICE therapy.  Avoid using the affected area while you are experiencing symptoms of tendinitis. This information is not intended to replace advice given to you by your health care provider. Make sure you discuss any questions you have with your health care provider. Document Revised: 04/09/2018 Document Reviewed: 02/20/2018 Elsevier Patient Education  2020 [employer]  

## 2020-02-24 NOTE — Progress Notes (Signed)
Subjective:  Patient ID: Natalie Fletcher, female    DOB: 12/11/1950  Age: 69 y.o. MRN: [id number]  CC: No chief complaint on file.   HPI TALULLAH TORRY presents for B hand pain - R>L, worse in the R index extensor Doing a lot of work  Outpatient Medications Prior to Visit  Medication Sig Dispense Refill  . [employer] D3 50 MCG (2000 UT) CAPS TAKE 1 CAPSULE BY MOUTH EVERY DAY 100 capsule 3  . Multiple Vitamins-Minerals (MATURE ADULT CENTURY PO) Take by mouth daily.    . pantoprazole (PROTONIX) 40 MG tablet TAKE 1 TABLET BY MOUTH EVERY DAY 30 MINUTES BEFORE A MEAL 90 tablet 3  . zaleplon (SONATA) 5 MG capsule TAKE 1-2 CAPSULES BY MOUTH AT BEDTIME AS NEEDED FOR SLEEP 60 capsule 3  . Calcium Carb-Cholecalciferol (CALCIUM 600 + D PO) Take 1 each by mouth daily.    . clobetasol (TEMOVATE) 0.05 % external solution Apply 1 application topically 2 (two) times daily. (Patient not taking: Reported on 07/15/2019) 50 mL 3  . diclofenac (VOLTAREN) 75 MG EC tablet Take 1 tablet (75 mg total) by mouth 2 (two) times daily. Follow-up appt is due must see provider for future refills (Patient not taking: Reported on 07/15/2019) 60 tablet 0  . escitalopram (LEXAPRO) 10 MG tablet TAKE 1 TABLET BY MOUTH DAILY. FOLLOW-UP APPT IS DUE MUST SEE PROVIDER FOR FUTURE REFILLS 90 tablet 1   No facility-administered medications prior to visit.    ROS: Review of Systems  Constitutional: Negative for activity change, appetite change, chills, fatigue and unexpected weight change.  HENT: Negative for congestion, mouth sores and sinus pressure.   Eyes: Negative for visual disturbance.  Respiratory: Negative for cough and chest tightness.   Gastrointestinal: Negative for abdominal pain and nausea.  Genitourinary: Negative for difficulty urinating, frequency and vaginal pain.  Musculoskeletal: Positive for arthralgias. Negative for back pain and gait problem.  Skin: Negative for pallor and rash.  Neurological: Negative for  dizziness, tremors, weakness, numbness and headaches.  Psychiatric/Behavioral: Negative for confusion and sleep disturbance.    Objective:  BP (!) 144/76 (BP Location: Right Arm, Patient Position: Sitting, Cuff Size: Normal)   Pulse 62   Temp 98.4 F (36.9 C) (Oral)   Ht 5\' 2"  (1.575 m)   Wt 144 lb (65.3 kg)   SpO2 98%   BMI 26.34 kg/m   BP Readings from Last 3 Encounters:  02/24/20 (!) 144/76  07/29/19 138/79  07/02/19 130/82    Wt Readings from Last 3 Encounters:  02/24/20 144 lb (65.3 kg)  07/29/19 144 lb 3.2 oz (65.4 kg)  07/15/19 144 lb 3.2 oz (65.4 kg)    Physical Exam Constitutional:      General: She is not in acute distress.    Appearance: She is well-developed.  HENT:     Head: Normocephalic.     Right Ear: External ear normal.     Left Ear: External ear normal.     Nose: Nose normal.  Eyes:     General:        Right eye: No discharge.        Left eye: No discharge.     Conjunctiva/sclera: Conjunctivae normal.     Pupils: Pupils are equal, round, and reactive to light.  Neck:     Thyroid: No thyromegaly.     Vascular: No JVD.     Trachea: No tracheal deviation.  Cardiovascular:     Rate and Rhythm: Normal rate  and regular rhythm.     Heart sounds: Normal heart sounds.  Pulmonary:     Effort: No respiratory distress.     Breath sounds: No stridor. No wheezing.  Abdominal:     General: Bowel sounds are normal. There is no distension.     Palpations: Abdomen is soft. There is no mass.     Tenderness: There is no abdominal tenderness. There is no guarding or rebound.  Musculoskeletal:        General: No tenderness.     Cervical back: Normal range of motion and neck supple.  Lymphadenopathy:     Cervical: No cervical adenopathy.  Skin:    Findings: No erythema or rash.  Neurological:     Mental Status: She is oriented to person, place, and time.     Cranial Nerves: No cranial nerve deficit.     Motor: No abnormal muscle tone.     Coordination:  Coordination normal.     Deep Tendon Reflexes: Reflexes normal.  Psychiatric:        Behavior: Behavior normal.        Thought Content: Thought content normal.        Judgment: Judgment normal.   R index ext tendon w/pain  Procedure Note :   Extens digit finger tendon Injection:   Indication : Tendonitis - finger   Risks including unsuccessful procedure , bleeding, infection, bruising, skin atrophy and others were explained to the patient in detail as well as the benefits. Informed consent was obtained and signed.   Tthe patient was placed in a comfortable position. Extensor digitorum tendon was marked and  the skin was prepped with Betadine and alcohol. 1 inch 25-gauge needle was used. The needle was advanced  into the skin down to tendon. It  was injected with 0.5 mL of 2% lidocaine and 10 mg of Depo-Medrol in a usual fashion.  Band-Aids applied.   Tolerated well. Complications: None. Good pain relief following the procedure.  Lab Results  Component Value Date   WBC 4.6 07/02/2019   HGB 12.3 07/02/2019   HCT 37.4 07/02/2019   PLT 310.0 07/02/2019   GLUCOSE 98 07/02/2019   CHOL 219 (H) 07/02/2019   TRIG 69.0 07/02/2019   HDL 86.60 07/02/2019   LDLDIRECT 106.8 04/12/2012   LDLCALC 118 (H) 07/02/2019   ALT 13 07/02/2019   AST 19 07/02/2019   NA 140 07/02/2019   K 3.8 07/02/2019   CL 101 07/02/2019   CREATININE 0.79 07/02/2019   BUN 12 07/02/2019   CO2 29 07/02/2019   TSH 1.30 07/02/2019   INR 0.91 05/13/2012    US BREAST LTD UNI RIGHT INC AXILLA  Result Date: 12/23/2018 CLINICAL DATA:  Possible asymmetry in the superior aspect of the right breast in the oblique implant displaced projection on a recent screening mammogram. The patient has retroglandular implants. EXAM: DIGITAL DIAGNOSTIC RIGHT MAMMOGRAM WITH CAD AND TOMO ULTRASOUND RIGHT BREAST COMPARISON:  Previous exam(s). ACR Breast Density Category b: There are scattered areas of fibroglandular density. FINDINGS: 3D  tomographic and 2D generated implant displaced true lateral and implant displaced spot compression oblique views of the right breast were obtained. On the implant displaced spot compression oblique views, there is a 1.7 x 0.9 cm asymmetry with some circumscribed and some irregular and indistinct margins. This has interspersed fat density and has an appearance similar to fibroglandular tissue elsewhere in the right breast. This also has an appearance similar to an implant displaced oblique view dated 11/01/2010.  No corresponding density is seen in the true lateral projection today or in the craniocaudal projection on the recent screening mammogram. Mammographic images were processed with CAD. On physical exam, no mass is palpable in the upper right breast. Targeted ultrasound is performed, showing normal appearing breast tissue throughout the upper right breast, including islands of normal fibroglandular tissue. No masses or other findings suspicious for malignancy were seen. IMPRESSION: No evidence of malignancy. The recently suspected right breast asymmetry is normal fibroglandular tissue. RECOMMENDATION: Bilateral screening mammogram in 1 year. I have discussed the findings and recommendations with the patient. Results were also provided in writing at the conclusion of the visit. If applicable, a reminder letter will be sent to the patient regarding the next appointment. BI-RADS CATEGORY  1: Negative. Electronically Signed   By: Claudie Revering M.D.   On: 12/23/2018 10:26   MM DIAG BREAST TOMO UNI RIGHT  Result Date: 12/23/2018 CLINICAL DATA:  Possible asymmetry in the superior aspect of the right breast in the oblique implant displaced projection on a recent screening mammogram. The patient has retroglandular implants. EXAM: DIGITAL DIAGNOSTIC RIGHT MAMMOGRAM WITH CAD AND TOMO ULTRASOUND RIGHT BREAST COMPARISON:  Previous exam(s). ACR Breast Density Category b: There are scattered areas of fibroglandular density.  FINDINGS: 3D tomographic and 2D generated implant displaced true lateral and implant displaced spot compression oblique views of the right breast were obtained. On the implant displaced spot compression oblique views, there is a 1.7 x 0.9 cm asymmetry with some circumscribed and some irregular and indistinct margins. This has interspersed fat density and has an appearance similar to fibroglandular tissue elsewhere in the right breast. This also has an appearance similar to an implant displaced oblique view dated 11/01/2010. No corresponding density is seen in the true lateral projection today or in the craniocaudal projection on the recent screening mammogram. Mammographic images were processed with CAD. On physical exam, no mass is palpable in the upper right breast. Targeted ultrasound is performed, showing normal appearing breast tissue throughout the upper right breast, including islands of normal fibroglandular tissue. No masses or other findings suspicious for malignancy were seen. IMPRESSION: No evidence of malignancy. The recently suspected right breast asymmetry is normal fibroglandular tissue. RECOMMENDATION: Bilateral screening mammogram in 1 year. I have discussed the findings and recommendations with the patient. Results were also provided in writing at the conclusion of the visit. If applicable, a reminder letter will be sent to the patient regarding the next appointment. BI-RADS CATEGORY  1: Negative. Electronically Signed   By: Claudie Revering M.D.   On: 12/23/2018 10:26    Assessment & Plan:    Walker Kehr, MD

## 2020-02-24 NOTE — Assessment & Plan Note (Signed)
R index extensor See procedure Medrol pack

## 2020-02-24 NOTE — Assessment & Plan Note (Signed)
B OA - worse  Medrol pack if worse Cut back on yard work

## 2020-06-30 ENCOUNTER — Other Ambulatory Visit: Payer: Self-pay

## 2020-06-30 ENCOUNTER — Other Ambulatory Visit: Payer: Medicare Other

## 2020-06-30 DIAGNOSIS — Z20822 Contact with and (suspected) exposure to covid-19: Secondary | ICD-10-CM

## 2020-07-02 LAB — NOVEL CORONAVIRUS, NAA: SARS-CoV-2, NAA: DETECTED — AB

## 2020-07-02 LAB — SARS-COV-2, NAA 2 DAY TAT

## 2020-07-03 ENCOUNTER — Other Ambulatory Visit: Payer: Self-pay | Admitting: Internal Medicine

## 2020-07-03 DIAGNOSIS — U071 COVID-19: Secondary | ICD-10-CM

## 2020-07-03 NOTE — Progress Notes (Signed)
I connected by phone with Natalie Fletcher on 07/03/2020 at 4:55 PM to discuss the potential use of a new treatment for mild to moderate COVID-19 viral infection in non-hospitalized patients.  This patient is a 69 y.o. female that meets the FDA criteria for Emergency Use Authorization of COVID monoclonal antibody casirivimab/imdevimab.  Has a (+) direct SARS-CoV-2 viral test result  Has mild or moderate COVID-19   Is NOT hospitalized due to COVID-19  Is within 10 days of symptom onset  Has at least one of the high risk factor(s) for progression to severe COVID-19 and/or hospitalization as defined in EUA.  Specific high risk criteria : Older age (>/= 71 yo)    I have spoken and communicated the following to the patient or parent/caregiver regarding COVID monoclonal antibody treatment:  1. FDA has authorized the emergency use for the treatment of mild to moderate COVID-19 in adults and pediatric patients with positive results of direct SARS-CoV-2 viral testing who are 39 years of age and older weighing at least 40 kg, and who are at high risk for progressing to severe COVID-19 and/or hospitalization.  2. The significant known and potential risks and benefits of COVID monoclonal antibody, and the extent to which such potential risks and benefits are unknown.  3. Information on available alternative treatments and the risks and benefits of those alternatives, including clinical trials.  4. Patients treated with COVID monoclonal antibody should continue to self-isolate and use infection control measures (e.g., wear mask, isolate, social distance, avoid sharing personal items, clean and disinfect "high touch" surfaces, and frequent handwashing) according to CDC guidelines.   5. The patient or parent/caregiver has the option to accept or refuse COVID monoclonal antibody treatment.  After reviewing this information with the patient, The patient agreed to proceed with receiving casirivimab\imdevimab  infusion and will be provided a copy of the Fact sheet prior to receiving the infusion.   Infusion scheduled for 9/19 at [place], NP 07/03/2020 4:55 PM

## 2020-07-04 ENCOUNTER — Ambulatory Visit (HOSPITAL_COMMUNITY)
Admission: RE | Admit: 2020-07-04 | Discharge: 2020-07-04 | Disposition: A | Discharge status: Home / Self Care | Payer: Medicare Other | Source: Ambulatory Visit | Attending: Pulmonary Disease | Admitting: Pulmonary Disease

## 2020-07-04 DIAGNOSIS — Z23 Encounter for immunization: Secondary | ICD-10-CM | POA: Insufficient documentation

## 2020-07-04 DIAGNOSIS — U071 COVID-19: Secondary | ICD-10-CM | POA: Insufficient documentation

## 2020-07-04 MED ORDER — SODIUM CHLORIDE 0.9 % IV SOLN
1200.0000 mg | Freq: Once | INTRAVENOUS | Status: AC
  Administered 2020-07-04: 1200 mg via INTRAVENOUS

## 2020-07-04 MED ORDER — ALBUTEROL SULFATE HFA 108 (90 BASE) MCG/ACT IN AERS: 2.0000 | INHALATION_SPRAY | Freq: Once | RESPIRATORY_TRACT | Status: DC | PRN

## 2020-07-04 MED ORDER — SODIUM CHLORIDE 0.9 % IV SOLN: INTRAVENOUS | Status: DC | PRN

## 2020-07-04 MED ORDER — FAMOTIDINE IN NACL 20-0.9 MG/50ML-% IV SOLN: 20.0000 mg | Freq: Once | INTRAVENOUS | Status: DC | PRN

## 2020-07-04 MED ORDER — METHYLPREDNISOLONE SODIUM SUCC 125 MG IJ SOLR: 125.0000 mg | Freq: Once | INTRAMUSCULAR | Status: DC | PRN

## 2020-07-04 MED ORDER — EPINEPHRINE 0.3 MG/0.3ML IJ SOAJ: 0.3000 mg | Freq: Once | INTRAMUSCULAR | Status: DC | PRN

## 2020-07-04 MED ORDER — DIPHENHYDRAMINE HCL 50 MG/ML IJ SOLN: 50.0000 mg | Freq: Once | INTRAMUSCULAR | Status: DC | PRN

## 2020-07-04 NOTE — Discharge Instructions (Signed)

## 2020-07-04 NOTE — Progress Notes (Signed)
  Diagnosis: COVID-19  Physician: Dr. Joya Gaskins  Procedure: [facility] Med: casirivimab\imdevimab infusion - Provided patient with casirivimab\imdevimab fact sheet for patients, parents and caregivers prior to infusion.  Complications: No immediate complications noted.  Discharge: Discharged home   Ludwig Lean 07/04/2020

## 2020-07-08 ENCOUNTER — Telehealth: Payer: Self-pay | Admitting: Internal Medicine

## 2020-07-08 NOTE — Telephone Encounter (Signed)
Patient called and was wanting advice on getting the Covid booster shot.

## 2020-07-09 NOTE — Telephone Encounter (Signed)
Left message for patient with info

## 2020-07-09 NOTE — Telephone Encounter (Signed)
Yes. Please call [place] [facility] at [phone]. She can also call the venue where you had your initial COVID 19 vaccination. Thanks, AP Booster shot is recommended) CPT 7 the

## 2020-08-04 ENCOUNTER — Other Ambulatory Visit: Payer: Self-pay | Admitting: Internal Medicine

## 2020-08-18 ENCOUNTER — Other Ambulatory Visit: Payer: Self-pay | Admitting: Internal Medicine

## 2020-09-28 ENCOUNTER — Telehealth: Payer: Self-pay | Admitting: Internal Medicine

## 2020-09-28 NOTE — Telephone Encounter (Signed)
Patient said that she wanted Dr. Alain Marion to call her. She would not say for what.   [phone].

## 2020-10-05 NOTE — Telephone Encounter (Signed)
She had a cough. It has resolved

## 2020-10-28 ENCOUNTER — Other Ambulatory Visit: Payer: Self-pay | Admitting: Internal Medicine

## 2020-11-21 ENCOUNTER — Other Ambulatory Visit: Payer: Self-pay | Admitting: Internal Medicine

## 2021-02-01 ENCOUNTER — Other Ambulatory Visit: Payer: Self-pay | Admitting: Obstetrics and Gynecology

## 2021-02-01 DIAGNOSIS — Z13820 Encounter for screening for osteoporosis: Secondary | ICD-10-CM

## 2021-02-01 DIAGNOSIS — E2839 Other primary ovarian failure: Secondary | ICD-10-CM

## 2021-02-16 ENCOUNTER — Other Ambulatory Visit: Payer: Self-pay | Admitting: Internal Medicine

## 2021-06-09 DIAGNOSIS — D649 Anemia, unspecified: Secondary | ICD-10-CM | POA: Insufficient documentation

## 2021-06-09 DIAGNOSIS — K625 Hemorrhage of anus and rectum: Secondary | ICD-10-CM | POA: Insufficient documentation

## 2021-06-23 ENCOUNTER — Other Ambulatory Visit: Payer: Self-pay | Admitting: Orthopedic Surgery

## 2021-06-23 DIAGNOSIS — M25562 Pain in left knee: Secondary | ICD-10-CM

## 2021-06-28 ENCOUNTER — Ambulatory Visit
Admission: RE | Admit: 2021-06-28 | Discharge: 2021-06-28 | Disposition: A | Discharge status: Home / Self Care | Payer: Medicare Other | Source: Ambulatory Visit | Attending: Orthopedic Surgery | Admitting: Orthopedic Surgery

## 2021-06-28 ENCOUNTER — Other Ambulatory Visit: Payer: Self-pay

## 2021-06-28 DIAGNOSIS — M25562 Pain in left knee: Secondary | ICD-10-CM

## 2021-07-13 ENCOUNTER — Other Ambulatory Visit: Payer: Self-pay

## 2021-07-13 ENCOUNTER — Ambulatory Visit (INDEPENDENT_AMBULATORY_CARE_PROVIDER_SITE_OTHER): Payer: Medicare Other

## 2021-07-13 DIAGNOSIS — Z23 Encounter for immunization: Secondary | ICD-10-CM

## 2021-07-13 NOTE — Progress Notes (Addendum)
Pt given High Dose flu vacc w/o any complications.  Medical screening examination/treatment/procedure(s) were performed by non-physician practitioner and as supervising physician I was immediately available for consultation/collaboration.  I agree with above. Lew Dawes, MD

## 2021-08-08 ENCOUNTER — Ambulatory Visit
Admission: RE | Admit: 2021-08-08 | Discharge: 2021-08-08 | Disposition: A | Discharge status: Home / Self Care | Payer: Medicare Other | Source: Ambulatory Visit | Attending: Obstetrics and Gynecology | Admitting: Obstetrics and Gynecology

## 2021-08-08 ENCOUNTER — Other Ambulatory Visit: Payer: Self-pay

## 2021-08-08 DIAGNOSIS — Z13820 Encounter for screening for osteoporosis: Secondary | ICD-10-CM

## 2021-08-08 DIAGNOSIS — E2839 Other primary ovarian failure: Secondary | ICD-10-CM

## 2022-04-06 ENCOUNTER — Ambulatory Visit: Payer: Medicare Other | Admitting: Internal Medicine

## 2022-04-24 ENCOUNTER — Ambulatory Visit (INDEPENDENT_AMBULATORY_CARE_PROVIDER_SITE_OTHER): Payer: Medicare Other | Admitting: Internal Medicine

## 2022-04-24 ENCOUNTER — Encounter: Payer: Self-pay | Admitting: Internal Medicine

## 2022-04-24 VITALS — BP 140/98 | HR 77 | Temp 99.4°F | Ht 62.0 in | Wt 152.0 lb

## 2022-04-24 DIAGNOSIS — R202 Paresthesia of skin: Secondary | ICD-10-CM

## 2022-04-24 DIAGNOSIS — R5383 Other fatigue: Secondary | ICD-10-CM | POA: Diagnosis not present

## 2022-04-24 DIAGNOSIS — R413 Other amnesia: Secondary | ICD-10-CM | POA: Diagnosis not present

## 2022-04-24 DIAGNOSIS — E785 Hyperlipidemia, unspecified: Secondary | ICD-10-CM | POA: Diagnosis not present

## 2022-04-24 DIAGNOSIS — E559 Vitamin D deficiency, unspecified: Secondary | ICD-10-CM

## 2022-04-24 DIAGNOSIS — F32A Depression, unspecified: Secondary | ICD-10-CM | POA: Diagnosis not present

## 2022-04-24 LAB — CBC WITH DIFFERENTIAL/PLATELET
Basophils Absolute: 0 10*3/uL (ref 0.0–0.1)
Basophils Relative: 0.6 % (ref 0.0–3.0)
Eosinophils Absolute: 0.1 10*3/uL (ref 0.0–0.7)
Eosinophils Relative: 1.3 % (ref 0.0–5.0)
HCT: 37.9 % (ref 36.0–46.0)
Hemoglobin: 12.6 g/dL (ref 12.0–15.0)
Lymphocytes Relative: 27.5 % (ref 12.0–46.0)
Lymphs Abs: 1.4 10*3/uL (ref 0.7–4.0)
MCHC: 33.1 g/dL (ref 30.0–36.0)
MCV: 94.9 fl (ref 78.0–100.0)
Monocytes Absolute: 0.4 10*3/uL (ref 0.1–1.0)
Monocytes Relative: 8.4 % (ref 3.0–12.0)
Neutro Abs: 3.1 10*3/uL (ref 1.4–7.7)
Neutrophils Relative %: 62.2 % (ref 43.0–77.0)
Platelets: 331 10*3/uL (ref 150.0–400.0)
RBC: 4 Mil/uL (ref 3.87–5.11)
RDW: 14.8 % (ref 11.5–15.5)
WBC: 5 10*3/uL (ref 4.0–10.5)

## 2022-04-24 LAB — COMPREHENSIVE METABOLIC PANEL
ALT: 15 U/L (ref 0–35)
AST: 25 U/L (ref 0–37)
Albumin: 4.5 g/dL (ref 3.5–5.2)
Alkaline Phosphatase: 76 U/L (ref 39–117)
BUN: 8 mg/dL (ref 6–23)
CO2: 30 mEq/L (ref 19–32)
Calcium: 9.5 mg/dL (ref 8.4–10.5)
Chloride: 102 mEq/L (ref 96–112)
Creatinine, Ser: 0.82 mg/dL (ref 0.40–1.20)
GFR: 71.31 mL/min (ref >60.00)
Glucose, Bld: 88 mg/dL (ref 70–99)
Potassium: 3.9 mEq/L (ref 3.5–5.1)
Sodium: 140 mEq/L (ref 135–145)
Total Bilirubin: 0.6 mg/dL (ref 0.2–1.2)
Total Protein: 6.9 g/dL (ref 6.0–8.3)

## 2022-04-24 LAB — LIPID PANEL
Cholesterol: 258 mg/dL — ABNORMAL HIGH (ref 0–200)
HDL: 110.6 mg/dL (ref >39.00)
LDL Cholesterol: 137 mg/dL — ABNORMAL HIGH (ref 0–99)
NonHDL: 147.89
Total CHOL/HDL Ratio: 2
Triglycerides: 54 mg/dL (ref 0.0–149.0)
VLDL: 10.8 mg/dL (ref 0.0–40.0)

## 2022-04-24 LAB — TSH: TSH: 1.45 u[IU]/mL (ref 0.35–5.50)

## 2022-04-24 LAB — VITAMIN D 25 HYDROXY (VIT D DEFICIENCY, FRACTURES): VITD: 34.46 ng/mL (ref 30.00–100.00)

## 2022-04-24 LAB — VITAMIN B12: Vitamin B-12: 435 pg/mL (ref 211–911)

## 2022-04-24 MED ORDER — FLUOXETINE HCL 10 MG PO CAPS: 10.0000 mg | ORAL_CAPSULE | Freq: Every day | ORAL | 3 refills | Status: DC

## 2022-04-24 MED ORDER — B COMPLEX PLUS PO TABS: 1.0000 | ORAL_TABLET | Freq: Every day | ORAL | 3 refills | Status: DC

## 2022-04-24 NOTE — Progress Notes (Signed)
Subjective:  Patient ID: Natalie Fletcher, female    DOB: 1951/07/09  Age: 71 y.o. MRN: [id number]  CC: No chief complaint on file.   HPI Natalie Fletcher presents for stress, depression, memory issues  Mowing 4 yards, big garden, doctors' appointments. B-in law is sick.  Family says she is forgetful  Outpatient Medications Prior to Visit  Medication Sig Dispense Refill   [facility] D3 50 MCG (2000 UT) CAPS TAKE 1 CAPSULE BY MOUTH EVERY DAY NEED OFFICE VISIT (Patient not taking: Reported on 04/24/2022) 100 capsule 3   methylPREDNISolone (MEDROL DOSEPAK) 4 MG TBPK tablet As directed 21 tablet 0   Multiple Vitamins-Minerals (MATURE ADULT CENTURY PO) Take by mouth daily. (Patient not taking: Reported on 04/24/2022)     pantoprazole (PROTONIX) 40 MG tablet TAKE 1 TABLET BY MOUTH EVERY DAY 30 MINUTES BEFORE A MEAL (Patient not taking: Reported on 04/24/2022) 90 tablet 3   zaleplon (SONATA) 5 MG capsule TAKE 1-2 CAPSULES BY MOUTH AT BEDTIME AS NEEDED FOR SLEEP (Patient not taking: Reported on 04/24/2022) 60 capsule 3   No facility-administered medications prior to visit.    ROS: Review of Systems  Constitutional:  Negative for activity change, appetite change, chills, fatigue and unexpected weight change.  HENT:  Negative for congestion, mouth sores and sinus pressure.   Eyes:  Negative for visual disturbance.  Respiratory:  Negative for cough and chest tightness.   Gastrointestinal:  Negative for abdominal pain and nausea.  Genitourinary:  Negative for difficulty urinating, frequency and vaginal pain.  Musculoskeletal:  Negative for back pain and gait problem.  Skin:  Negative for pallor and rash.  Neurological:  Negative for dizziness, tremors, weakness, numbness and headaches.  Hematological:  Does not bruise/bleed easily.  Psychiatric/Behavioral:  Positive for decreased concentration, dysphoric mood and sleep disturbance. Negative for confusion and suicidal ideas.     Objective:  BP (!)  140/98 (BP Location: Left Arm, Patient Position: Sitting, Cuff Size: Normal)   Pulse 77   Temp 99.4 F (37.4 C) (Oral)   Ht '5\' 2"'$  (1.575 m)   Wt 152 lb (68.9 kg)   SpO2 90%   BMI 27.80 kg/m   BP Readings from Last 3 Encounters:  04/24/22 (!) 140/98  07/04/20 (!) 127/111  02/24/20 (!) 144/76    Wt Readings from Last 3 Encounters:  04/24/22 152 lb (68.9 kg)  02/24/20 144 lb (65.3 kg)  07/29/19 144 lb 3.2 oz (65.4 kg)    Physical Exam Constitutional:      General: She is not in acute distress.    Appearance: She is well-developed. She is obese.  HENT:     Head: Normocephalic.     Right Ear: External ear normal.     Left Ear: External ear normal.     Nose: Nose normal.  Eyes:     General:        Right eye: No discharge.        Left eye: No discharge.     Conjunctiva/sclera: Conjunctivae normal.     Pupils: Pupils are equal, round, and reactive to light.  Neck:     Thyroid: No thyromegaly.     Vascular: No JVD.     Trachea: No tracheal deviation.  Cardiovascular:     Rate and Rhythm: Normal rate and regular rhythm.     Heart sounds: Normal heart sounds.  Pulmonary:     Effort: No respiratory distress.     Breath sounds: No stridor. No wheezing.  Abdominal:     General: Bowel sounds are normal. There is no distension.     Palpations: Abdomen is soft. There is no mass.     Tenderness: There is no abdominal tenderness. There is no guarding or rebound.  Musculoskeletal:        General: No tenderness.     Cervical back: Normal range of motion and neck supple. No rigidity.  Lymphadenopathy:     Cervical: No cervical adenopathy.  Skin:    Findings: No erythema or rash.  Neurological:     Mental Status: She is oriented to person, place, and time.     Cranial Nerves: No cranial nerve deficit.     Motor: No abnormal muscle tone.     Coordination: Coordination normal.     Deep Tendon Reflexes: Reflexes normal.  Psychiatric:        Behavior: Behavior normal.         Thought Content: Thought content normal.        Judgment: Judgment normal.     Lab Results  Component Value Date   WBC 4.6 07/02/2019   HGB 12.3 07/02/2019   HCT 37.4 07/02/2019   PLT 310.0 07/02/2019   GLUCOSE 98 07/02/2019   CHOL 219 (H) 07/02/2019   TRIG 69.0 07/02/2019   HDL 86.60 07/02/2019   LDLDIRECT 106.8 04/12/2012   LDLCALC 118 (H) 07/02/2019   ALT 13 07/02/2019   AST 19 07/02/2019   NA 140 07/02/2019   K 3.8 07/02/2019   CL 101 07/02/2019   CREATININE 0.79 07/02/2019   BUN 12 07/02/2019   CO2 29 07/02/2019   TSH 1.30 07/02/2019   INR 0.91 05/13/2012    DG BONE DENSITY (DXA)  Result Date: 08/08/2021 EXAM: DUAL X-RAY ABSORPTIOMETRY (DXA) FOR BONE MINERAL DENSITY IMPRESSION: Referring Physician:  Newton Pigg Your patient completed a bone mineral density test using GE Lunar iDXA system (analysis version: 16). Technologist: [place] PATIENT: Name: Natalie, Fletcher Patient ID: [id number] Birth Date: 19-Nov-1950 Height: 61.5 in. Sex: Female Measured: 08/08/2021 Weight: 152.2 lbs. Indications: Advanced Age, Caucasian, Estrogen Deficient, Postmenopausal Fractures: NONE Treatments: Vitamin D (E933.5) ASSESSMENT: The BMD measured at Femur Neck Right is 0.982 g/cm2 with a T-score of -0.4. This patient is considered NORMAL according to [facility]) criteria. The quality of the exam is good. Site Region Measured Date Measured Age YA BMD Significant CHANGE T-score DualFemur Neck Right 08/08/2021    70.0         -0.4    0.982 g/cm2 AP Spine  L1-L4      08/08/2021    [age]         0.3     1.218 g/cm2 DualFemur Total Mean 08/08/2021    [age]         0.8     1.113 g/cm2 World Health Organization [facility]) criteria for post-menopausal, Caucasian Women: Normal       T-score at or above -1 SD Osteopenia   T-score between -1 and -2.5 SD Osteoporosis T-score at or below -2.5 SD RECOMMENDATION: 1. All patients should optimize calcium and vitamin D intake. 2. Consider FDA-approved medical  therapies in postmenopausal women and men aged 51 years and older, based on the following: a. A hip or vertebral (clinical or morphometric) fracture. b. T-score = -2.5 at the femoral neck or spine after appropriate evaluation to exclude secondary causes. c. Low bone mass (T-score between -1.0 and -2.5 at the femoral neck or spine) and a 10-year probability of  a hip fracture = 3% or a 10-year probability of a major osteoporosis-related fracture = 20% based on the [place]-adapted WHO algorithm. d. Clinician judgment and/or patient preferences may indicate treatment for people with 10-year fracture probabilities above or below these levels. FOLLOW-UP: Patients with diagnosis of osteoporosis or at high risk for fracture should have regular bone mineral density tests.? Patients eligible for Medicare are allowed routine testing every 2 years.? The testing frequency can be increased to one year for patients who have rapidly progressing disease, are receiving or discontinuing medical therapy to restore bone mass, or have additional risk factors. I have reviewed this study and agree with the findings. Mark A. Thornton Papas, M.D. [facility] Electronically Signed   By: Lavonia Dana M.D.   On: 08/08/2021 08:43    Assessment & Plan:   Problem List Items Addressed This Visit     Depression    Situational. Start Prozac B-in law is sick.  Mowing 4 yards, big garden, doctors' appointments.      Relevant Medications   B Complex-Folic Acid (B COMPLEX PLUS) TABS   FLUoxetine (PROZAC) 10 MG capsule   Other Relevant Orders   TSH   Urinalysis   CBC with Differential/Platelet   Comprehensive metabolic panel   Lipid panel   Vitamin B12   VITAMIN D 25 Hydroxy (Vit-D Deficiency, Fractures)   Fatigue    Stress related Start B complex, Lion's mane mushroom B-in law is sick.  Mowing 4 yards, big garden, doctors' appointments.      Relevant Medications   B Complex-Folic Acid (B COMPLEX PLUS) TABS   FLUoxetine  (PROZAC) 10 MG capsule   Other Relevant Orders   TSH   Urinalysis   CBC with Differential/Platelet   Comprehensive metabolic panel   Lipid panel   Vitamin B12   VITAMIN D 25 Hydroxy (Vit-D Deficiency, Fractures)   Memory deficit - Primary    Stress discussed Start Prozac Start B complex, Lion's mane mushroom B-in law is sick.  Mowing 4 yards, big garden, doctors' appointments.      Relevant Medications   B Complex-Folic Acid (B COMPLEX PLUS) TABS   FLUoxetine (PROZAC) 10 MG capsule   Other Relevant Orders   TSH   Urinalysis   CBC with Differential/Platelet   Comprehensive metabolic panel   Lipid panel   Vitamin B12   VITAMIN D 25 Hydroxy (Vit-D Deficiency, Fractures)   Other Visit Diagnoses     Paresthesia       Relevant Orders   Vitamin B12   Vitamin D deficiency       Relevant Orders   VITAMIN D 25 Hydroxy (Vit-D Deficiency, Fractures)   Dyslipidemia       Relevant Orders   Lipid panel         Meds ordered this encounter  Medications   B Complex-Folic Acid (B COMPLEX PLUS) TABS    Sig: Take 1 tablet by mouth daily.    Dispense:  100 tablet    Refill:  3   FLUoxetine (PROZAC) 10 MG capsule    Sig: Take 1 capsule (10 mg total) by mouth daily.    Dispense:  90 capsule    Refill:  3      Follow-up: Return in about 3 months (around 07/25/2022) for a follow-up visit.  Walker Kehr, MD

## 2022-04-24 NOTE — Assessment & Plan Note (Addendum)
Situational. Start Prozac B-in law is sick.  Mowing 4 yards, big garden, doctors' appointments.

## 2022-04-24 NOTE — Assessment & Plan Note (Signed)
Stress related Start B complex, Lion's mane mushroom B-in law is sick.  Mowing 4 yards, big garden, doctors' appointments.

## 2022-04-24 NOTE — Assessment & Plan Note (Signed)
Stress discussed Start Prozac Start B complex, Lion's mane mushroom B-in law is sick.  Mowing 4 yards, big garden, doctors' appointments.

## 2022-04-25 ENCOUNTER — Telehealth: Payer: Self-pay | Admitting: Internal Medicine

## 2022-04-25 LAB — URINALYSIS, ROUTINE W REFLEX MICROSCOPIC
Bilirubin Urine: NEGATIVE
Hgb urine dipstick: NEGATIVE
Ketones, ur: NEGATIVE
Nitrite: NEGATIVE
RBC / HPF: NONE SEEN (ref 0–?)
Specific Gravity, Urine: 1.01 (ref 1.000–1.030)
Total Protein, Urine: NEGATIVE
Urine Glucose: NEGATIVE
Urobilinogen, UA: 0.2 (ref 0.0–1.0)
pH: 7 (ref 5.0–8.0)

## 2022-04-25 NOTE — Telephone Encounter (Signed)
(  FYI)     Pt  called in to state  she wanted Doctor to know  she has  family history of kidney cancer.

## 2022-04-27 NOTE — Telephone Encounter (Signed)
Noted. thx 

## 2022-05-18 ENCOUNTER — Telehealth: Payer: Self-pay | Admitting: Internal Medicine

## 2022-05-18 NOTE — Telephone Encounter (Signed)
PT visits today with a pre-op Risk Assessment form to be filled out and faxed. Form has been left in Dr.Plotnikov's mailbox.  CB: [phone] FAX: [phone]

## 2022-07-06 ENCOUNTER — Telehealth: Payer: Self-pay

## 2022-07-06 NOTE — Telephone Encounter (Signed)
Error

## 2022-07-24 ENCOUNTER — Ambulatory Visit: Payer: Self-pay | Admitting: Licensed Clinical Social Worker

## 2022-07-24 NOTE — Patient Outreach (Signed)
  Care Coordination  Initial Visit Note   07/24/2022 Name: Natalie Fletcher MRN: [id number] DOB: Jan 30, 1951  Natalie Fletcher is a 71 y.o. year old female who sees Natalie Fletcher, Natalie Lacks, MD for primary care. I spoke with  Natalie Fletcher by phone today.  What matters to the patients health and wellness today?    Patient is experiencing symptoms of  mild depression which seems to be exacerbated by family stress..   Recommendation: Patient may benefit from, and is in agreement to Review educational information shared today.     Goals Addressed             This Visit's Progress    Care Coordination Activities       Care Coordination Interventions: Discussed benefits of Medicare Annual Wellness Visit: scheduled 07/28/22  Concerns with obtaining required medications: none Assessed Social Determinants of Health PHQ2/ PHQ9 completed Provided general psycho-education for mental health needs  Reviewed mental health medications and discussed importance of compliance: not taking medication for depression Participation in counseling encouraged  Provided EMMI education information on :Coping with a Health Condition and Depression.          SDOH assessments and interventions completed:  Yes  SDOH Interventions Today    Flowsheet Row Most Recent Value  SDOH Interventions   Food Insecurity Interventions Intervention Not Indicated  Housing Interventions Intervention Not Indicated  Transportation Interventions Intervention Not Indicated  Depression Interventions/Treatment  Counseling  Stress Interventions Intervention Not Indicated       Care Coordination Interventions Activated:  Yes  Care Coordination Interventions:  Yes, provided   Follow up plan: Follow up call scheduled for 2 weeks    Encounter Outcome:  Pt. Visit Completed   Casimer Lanius, [place] [phone]

## 2022-07-24 NOTE — Patient Instructions (Signed)
Visit Information  Thank you for taking time to visit with me today. Please don't hesitate to contact me if I can be of assistance to you.   Following are the goals we discussed today:   Goals Addressed             This Visit's Progress    Care Coordination Activities       Care Coordination Interventions: Discussed benefits of Medicare Annual Wellness Visit: scheduled 07/28/22  Concerns with obtaining required medications: none Assessed Social Determinants of Health PHQ2/ PHQ9 completed Provided general psycho-education for mental health needs  Reviewed mental health medications and discussed importance of compliance: not taking medication for depression Participation in counseling encouraged  Provided EMMI education information on :Coping with a Health Condition and Depression. Look for an e-mail from [place] next appointment is by telephone on 08/07/2022 at 9:00  Please call the care guide team at [phone] if you need to cancel or reschedule your appointment.   If you are experiencing a Mental Health or [place] or need someone to talk to, please call the Suicide and Crisis Lifeline: 988 call 1-800-273-TALK (toll free, 24 hour hotline)   Patient verbalizes understanding of instructions and care plan provided today and agrees to view in [place]. Active MyChart status and patient understanding of how to access instructions and care plan via MyChart confirmed with patient.     Care Coordination team works in collaboration with your primary care doctor.  Please call [phone] if you would like to schedule a phone appointment with a Nurse or Social work Care Coordinator to assist with navigating your physical and mental health needs.    Casimer Lanius, [place] [phone]

## 2022-07-28 ENCOUNTER — Ambulatory Visit (INDEPENDENT_AMBULATORY_CARE_PROVIDER_SITE_OTHER): Payer: Medicare Other

## 2022-07-28 VITALS — Ht 62.0 in | Wt 144.0 lb

## 2022-07-28 DIAGNOSIS — Z1231 Encounter for screening mammogram for malignant neoplasm of breast: Secondary | ICD-10-CM

## 2022-07-28 DIAGNOSIS — Z1211 Encounter for screening for malignant neoplasm of colon: Secondary | ICD-10-CM

## 2022-07-28 DIAGNOSIS — Z0000 Encounter for general adult medical examination without abnormal findings: Secondary | ICD-10-CM | POA: Diagnosis not present

## 2022-07-28 NOTE — Patient Instructions (Signed)
Natalie Fletcher , Thank you for taking time to come for your Medicare Wellness Visit. I appreciate your ongoing commitment to your health goals. Please review the following plan we discussed and let me know if I can assist you in the future.   These are the goals we discussed:  Goals      Care Coordination Activities     Care Coordination Interventions: Discussed benefits of Medicare Annual Wellness Visit: scheduled 07/28/22  Concerns with obtaining required medications: none Assessed Social Determinants of Health PHQ2/ PHQ9 completed Provided general psycho-education for mental health needs  Reviewed mental health medications and discussed importance of compliance: not taking medication for depression Participation in counseling encouraged  Provided EMMI education information on :Coping with a Health Condition and Depression.        DIET - INCREASE WATER INTAKE        This is a list of the screening recommended for you and due dates:  Health Maintenance  Topic Date Due   Zoster (Shingles) Vaccine (1 of 2) Never done   Pneumonia Vaccine (1 - PCV) Never done   Mammogram  01/08/2016   COVID-19 Vaccine (4 - Pfizer series) 11/24/2020   Flu Shot  05/16/2022   Colon Cancer Screening  07/28/2022   Tetanus Vaccine  05/31/2032   DEXA scan (bone density measurement)  Completed   Hepatitis C Screening: USPSTF Recommendation to screen - Ages 24-79 yo.  Completed   HPV Vaccine  Aged Out    Advanced directives: Please bring a copy of your health care power of attorney and living will to the office to be added to your chart at your convenience.   Conditions/risks identified: Aim for 30 minutes of exercise or brisk walking, 6-8 glasses of water, and 5 servings of fruits and vegetables each day.   Next appointment: Follow up in one year for your annual wellness visit    Preventive Care 65 Years and Older, Female Preventive care refers to lifestyle choices and visits with your health care  provider that can promote health and wellness. What does preventive care include? A yearly physical exam. This is also called an annual well check. Dental exams once or twice a year. Routine eye exams. Ask your health care provider how often you should have your eyes checked. Personal lifestyle choices, including: Daily care of your teeth and gums. Regular physical activity. Eating a healthy diet. Avoiding tobacco and drug use. Limiting alcohol use. Practicing safe sex. Taking low-dose aspirin every day. Taking vitamin and mineral supplements as recommended by your health care provider. What happens during an annual well check? The services and screenings done by your health care provider during your annual well check will depend on your age, overall health, lifestyle risk factors, and family history of disease. Counseling  Your health care provider may ask you questions about your: Alcohol use. Tobacco use. Drug use. Emotional well-being. Home and relationship well-being. Sexual activity. Eating habits. History of falls. Memory and ability to understand (cognition). Work and work Statistician. Reproductive health. Screening  You may have the following tests or measurements: Height, weight, and BMI. Blood pressure. Lipid and cholesterol levels. These may be checked every 5 years, or more frequently if you are over 67 years old. Skin check. Lung cancer screening. You may have this screening every year starting at age 61 if you have a 30-pack-year history of smoking and currently smoke or have quit within the past 15 years. Fecal occult blood test (FOBT) of the stool.  You may have this test every year starting at age 85. Flexible sigmoidoscopy or colonoscopy. You may have a sigmoidoscopy every 5 years or a colonoscopy every 10 years starting at age 78. Hepatitis C blood test. Hepatitis B blood test. Sexually transmitted disease (STD) testing. Diabetes screening. This is done by  checking your blood sugar (glucose) after you have not eaten for a while (fasting). You may have this done every 1-3 years. Bone density scan. This is done to screen for osteoporosis. You may have this done starting at age 76. Mammogram. This may be done every 1-2 years. Talk to your health care provider about how often you should have regular mammograms. Talk with your health care provider about your test results, treatment options, and if necessary, the need for more tests. Vaccines  Your health care provider may recommend certain vaccines, such as: Influenza vaccine. This is recommended every year. Tetanus, diphtheria, and acellular pertussis (Tdap, Td) vaccine. You may need a Td booster every 10 years. Zoster vaccine. You may need this after age 42. Pneumococcal 13-valent conjugate (PCV13) vaccine. One dose is recommended after age 62. Pneumococcal polysaccharide (PPSV23) vaccine. One dose is recommended after age 63. Talk to your health care provider about which screenings and vaccines you need and how often you need them. This information is not intended to replace advice given to you by your health care provider. Make sure you discuss any questions you have with your health care provider. Document Released: 10/29/2015 Document Revised: 06/21/2016 Document Reviewed: 08/03/2015 Elsevier Interactive Patient Education  2017 [place] Prevention in the Home Falls can cause injuries. They can happen to people of all ages. There are many things you can do to make your home safe and to help prevent falls. What can I do on the outside of my home? Regularly fix the edges of walkways and driveways and fix any cracks. Remove anything that might make you trip as you walk through a door, such as a raised step or threshold. Trim any bushes or trees on the path to your home. Use bright outdoor lighting. Clear any walking paths of anything that might make someone trip, such as rocks or  tools. Regularly check to see if handrails are loose or broken. Make sure that both sides of any steps have handrails. Any raised decks and porches should have guardrails on the edges. Have any leaves, snow, or ice cleared regularly. Use sand or salt on walking paths during winter. Clean up any spills in your garage right away. This includes oil or grease spills. What can I do in the bathroom? Use night lights. Install grab bars by the toilet and in the tub and shower. Do not use towel bars as grab bars. Use non-skid mats or decals in the tub or shower. If you need to sit down in the shower, use a plastic, non-slip stool. Keep the floor dry. Clean up any water that spills on the floor as soon as it happens. Remove soap buildup in the tub or shower regularly. Attach bath mats securely with double-sided non-slip rug tape. Do not have throw rugs and other things on the floor that can make you trip. What can I do in the bedroom? Use night lights. Make sure that you have a light by your bed that is easy to reach. Do not use any sheets or blankets that are too big for your bed. They should not hang down onto the floor. Have a firm chair that has  side arms. You can use this for support while you get dressed. Do not have throw rugs and other things on the floor that can make you trip. What can I do in the kitchen? Clean up any spills right away. Avoid walking on wet floors. Keep items that you use a lot in easy-to-reach places. If you need to reach something above you, use a strong step stool that has a grab bar. Keep electrical cords out of the way. Do not use floor polish or wax that makes floors slippery. If you must use wax, use non-skid floor wax. Do not have throw rugs and other things on the floor that can make you trip. What can I do with my stairs? Do not leave any items on the stairs. Make sure that there are handrails on both sides of the stairs and use them. Fix handrails that are  broken or loose. Make sure that handrails are as long as the stairways. Check any carpeting to make sure that it is firmly attached to the stairs. Fix any carpet that is loose or worn. Avoid having throw rugs at the top or bottom of the stairs. If you do have throw rugs, attach them to the floor with carpet tape. Make sure that you have a light switch at the top of the stairs and the bottom of the stairs. If you do not have them, ask someone to add them for you. What else can I do to help prevent falls? Wear shoes that: Do not have high heels. Have rubber bottoms. Are comfortable and fit you well. Are closed at the toe. Do not wear sandals. If you use a stepladder: Make sure that it is fully opened. Do not climb a closed stepladder. Make sure that both sides of the stepladder are locked into place. Ask someone to hold it for you, if possible. Clearly mark and make sure that you can see: Any grab bars or handrails. First and last steps. Where the edge of each step is. Use tools that help you move around (mobility aids) if they are needed. These include: Canes. Walkers. Scooters. Crutches. Turn on the lights when you go into a dark area. Replace any light bulbs as soon as they burn out. Set up your furniture so you have a clear path. Avoid moving your furniture around. If any of your floors are uneven, fix them. If there are any pets around you, be aware of where they are. Review your medicines with your doctor. Some medicines can make you feel dizzy. This can increase your chance of falling. Ask your doctor what other things that you can do to help prevent falls. This information is not intended to replace advice given to you by your health care provider. Make sure you discuss any questions you have with your health care provider. Document Released: 07/29/2009 Document Revised: 03/09/2016 Document Reviewed: 11/06/2014 Elsevier Interactive Patient Education  2017 [employer].

## 2022-07-28 NOTE — Progress Notes (Cosign Needed)
Subjective:   Natalie Fletcher is a 71 y.o. female who presents for an Initial Medicare Annual Wellness Visit.   I connected with  Natalie Fletcher on 07/28/22 by a audio enabled telemedicine application and verified that I am speaking with the correct person using two identifiers.  Patient Location: Home  Provider Location: Office/Clinic  I discussed the limitations of evaluation and management by telemedicine. The patient expressed understanding and agreed to proceed.  Review of Systems     Cardiac Risk Factors include: advanced age (>75mn, >>28women)     Objective:    Today's Vitals   07/28/22 0827  Weight: 144 lb (65.3 kg)  Height: '5\' 2"'$  (1.575 m)   Body mass index is 26.34 kg/m.     07/28/2022    8:31 AM 05/20/2012    6:58 AM 05/13/2012    8:30 AM  Advanced Directives  Does Patient Have a Medical Advance Directive? Yes  Patient does not have advance directive;Patient would like information  Type of Advance Directive [place] will    Copy of HU[place] Chart? No - copy requested    Pre-existing out of facility DNR order (yellow form or pink MOST form)  No     Current Medications (verified) Outpatient Encounter Medications as of 07/28/2022  Medication Sig   B Complex-Folic Acid (B COMPLEX PLUS) TABS Take 1 tablet by mouth daily.   [facility] D3 50 MCG (2000 UT) CAPS TAKE 1 CAPSULE BY MOUTH EVERY DAY NEED OFFICE VISIT   FLUoxetine (PROZAC) 10 MG capsule Take 1 capsule (10 mg total) by mouth daily.   No facility-administered encounter medications on file as of 07/28/2022.    Allergies (verified) Codeine   History: Past Medical History:  Diagnosis Date   Anemia    Arthritis    Depression    GERD (gastroesophageal reflux disease)    Past Surgical History:  Procedure Laterality Date   COLONOSCOPY  12/2007   PARTIAL KNEE ARTHROPLASTY  05/20/2012   Procedure: UNICOMPARTMENTAL KNEE;  Surgeon: SRudean Haskell MD;   [facility]  Service: Orthopedics;  Laterality: Right;  Uni Medial Compartmental Right Knee    Family History  Problem Relation Age of Onset   Kidney cancer Mother 79      kidney ca   COPD Father    Kidney cancer Maternal Grandmother 56      kidney ca   Breast cancer Maternal Aunt    Colon cancer Neg Hx    Rectal cancer Neg Hx    Stomach cancer Neg Hx    Social History   Socioeconomic History   Marital status: Married    Spouse name: Not on file   Number of children: 2   Years of education: Not on file   Highest education level: Not on file  Occupational History    Employer: [employer]  Tobacco Use   Smoking status: Never   Smokeless tobacco: Never  Vaping Use   Vaping Use: Never used  Substance and Sexual Activity   Alcohol use: Yes    Alcohol/week: 1.0 standard drink of alcohol    Types: 1 Glasses of wine per week    Comment: occasionally   Drug use: No   Sexual activity: Yes    Birth control/protection: Post-menopausal  Other Topics Concern   Not on file  Social History Narrative   Not on file   Social Determinants of HRadio broadcast assistant  Strain: Low Risk  (07/28/2022)   Overall Financial Resource Strain (CARDIA)    Difficulty of Paying Living Expenses: Not hard at all  Food Insecurity: No Food Insecurity (07/28/2022)   Hunger Vital Sign    Worried About Running Out of Food in the Last Year: Never true    Ran Out of Food in the Last Year: Never true  Transportation Needs: No Transportation Needs (07/28/2022)   PRAPARE - Hydrologist (Medical): No    Lack of Transportation (Non-Medical): No  Physical Activity: Insufficiently Active (07/28/2022)   Exercise Vital Sign    Days of Exercise per Week: 3 days    Minutes of Exercise per Session: 30 min  Stress: No Stress Concern Present (07/28/2022)   [place]    Feeling of Stress : Not at all  Recent  Concern: Stress - Stress Concern Present (07/24/2022)   [place]    Feeling of Stress : To some extent  Social Connections: Socially Integrated (07/28/2022)   Social Connection and Isolation Panel [NHANES]    Frequency of Communication with Friends and Family: More than three times a week    Frequency of Social Gatherings with Friends and Family: More than three times a week    Attends Religious Services: More than 4 times per year    Active Member of [employer] or Organizations: Yes    Attends Music therapist: More than 4 times per year    Marital Status: Married    Tobacco Counseling Counseling given: Not Answered   Clinical Intake:  Pre-visit preparation completed: Yes  Pain : No/denies pain     Nutritional Risks: None Diabetes: No  How often do you need to have someone help you when you read instructions, pamphlets, or other written materials from your doctor or pharmacy?: 1 - Never  Diabetic?no   Interpreter Needed?: No  Information entered by :: Jadene Pierini, LPN   Activities of Daily Living    07/28/2022    8:31 AM  In your present state of health, do you have any difficulty performing the following activities:  Hearing? 0  Vision? 0  Difficulty concentrating or making decisions? 0  Walking or climbing stairs? 0  Dressing or bathing? 0  Doing errands, shopping? 0  Preparing Food and eating ? N  Using the Toilet? N  In the past six months, have you accidently leaked urine? N  Do you have problems with loss of bowel control? N  Managing your Medications? N  Managing your Finances? N  Housekeeping or managing your Housekeeping? N    Patient Care Team: Plotnikov, Evie Lacks, MD as PCP - Lorenza Cambridge, MD as Consulting Physician (Orthopedic Surgery) Inda Castle, MD (Inactive) as Consulting Physician (Gastroenterology) Newton Pigg, MD as Consulting Physician  (Obstetrics and Gynecology)  Indicate any recent Medical Services you may have received from other than [facility] providers in the past year (date may be approximate).     Assessment:   This is a routine wellness examination for [name].  Hearing/Vision screen Vision Screening - Comments:: Annual eye exam wear glasses/Contacts   Dietary issues and exercise activities discussed: Current Exercise Habits: Home exercise routine, Type of exercise: walking, Time (Minutes): 30, Frequency (Times/Week): 3, Weekly Exercise (Minutes/Week): 90, Intensity: Mild, Exercise limited by: None identified   Goals Addressed  This Visit's Progress    DIET - INCREASE WATER INTAKE         Depression Screen    07/28/2022    8:30 AM 07/24/2022    9:05 AM 04/24/2022    3:00 PM 09/12/2017    8:34 AM  PHQ 2/9 Scores  PHQ - 2 Score 0 2 1 0  PHQ- 9 Score 0 7      Fall Risk    07/28/2022    8:27 AM 04/24/2022    3:00 PM 09/12/2017    8:34 AM  [place] in the past year? 0 1 No  Number falls in past yr: 0 0   Injury with Fall? 0 1   Risk for fall due to : No Fall Risks No Fall Risks   Follow up Falls prevention discussed Falls evaluation completed     FALL RISK PREVENTION PERTAINING TO THE HOME:  Any stairs in or around the home? Yes  If so, are there any without handrails? No  Home free of loose throw rugs in walkways, pet beds, electrical cords, etc? Yes  Adequate lighting in your home to reduce risk of falls? Yes   ASSISTIVE DEVICES UTILIZED TO PREVENT FALLS:  Life alert? No  Use of a cane, walker or w/c? No  Grab bars in the bathroom? No  Shower chair or bench in shower? Yes  Elevated toilet seat or a handicapped toilet? No          Immunizations Immunization History  Administered Date(s) Administered   Fluad Quad(high Dose 65+) 07/02/2019, 07/13/2021   Influenza Split 07/17/2011, 07/16/2012   Influenza Whole 07/27/2008   Influenza, High Dose Seasonal PF  06/27/2016, 07/08/2018, 07/26/2020   Influenza,inj,Quad PF,6+ Mos 06/23/2014, 07/10/2015   Influenza-Unspecified 07/05/2017   PFIZER(Purple Top)SARS-COV-2 Vaccination 11/21/2019, 12/16/2019, 09/29/2020   PPD Test 12/14/2015   Tdap 05/31/2022   Zoster, Live 07/17/2011    TDAP status: Up to date  Flu Vaccine status: Up to date  Pneumococcal vaccine status: Up to date  Covid-19 vaccine status: Completed vaccines  Qualifies for Shingles Vaccine? Yes   Zostavax completed No   Shingrix Completed?: No.    Education has been provided regarding the importance of this vaccine. Patient has been advised to call insurance company to determine out of pocket expense if they have not yet received this vaccine. Advised may also receive vaccine at local pharmacy or Health Dept. Verbalized acceptance and understanding.  Screening Tests Health Maintenance  Topic Date Due   Zoster Vaccines- Shingrix (1 of 2) Never done   Pneumonia Vaccine 77+ Years old (1 - PCV) Never done   MAMMOGRAM  01/08/2016   COVID-19 Vaccine (4 - Pfizer series) 11/24/2020   INFLUENZA VACCINE  05/16/2022   COLONOSCOPY (Pts 45-59yr Insurance coverage will need to be confirmed)  07/28/2022   TETANUS/TDAP  05/31/2032   DEXA SCAN  Completed   Hepatitis C Screening  Completed   HPV VACCINES  Aged Out    Health Maintenance  Health Maintenance Due  Topic Date Due   Zoster Vaccines- Shingrix (1 of 2) Never done   Pneumonia Vaccine 73 Years old (1 - PCV) Never done   MAMMOGRAM  01/08/2016   COVID-19 Vaccine (4 - Pfizer series) 11/24/2020   INFLUENZA VACCINE  05/16/2022   COLONOSCOPY (Pts 45-435yrInsurance coverage will need to be confirmed)  07/28/2022    Colorectal cancer screening: Referral to GI placed 07/28/2022. Pt aware the office will call re: appt.  Mammogram status: Ordered 07/28/2022. Pt provided with contact info and advised to call to schedule appt.   Bone Density status: Completed 08/08/2021. Results  reflect: Bone density results: OSTEOPENIA. Repeat every 5 years.  Lung Cancer Screening: (Low Dose CT Chest recommended if Age 44-80 years, 30 pack-year currently smoking OR have quit w/in 15years.) does not qualify.   Lung Cancer Screening Referral: n/a  Additional Screening:  Hepatitis C Screening: does not qualify; Completed 01/11/2016  Vision Screening: Recommended annual ophthalmology exams for early detection of glaucoma and other disorders of the eye. Is the patient up to date with their annual eye exam?  Yes  Who is the provider or what is the name of the office in which the patient attends annual eye exams? Dr.Groat  If pt is not established with a provider, would they like to be referred to a provider to establish care? No .   Dental Screening: Recommended annual dental exams for proper oral hygiene  Community Resource Referral / Chronic Care Management: CRR required this visit?  No   CCM required this visit?  No      Plan:     I have personally reviewed and noted the following in the patient's chart:   Medical and social history Use of alcohol, tobacco or illicit drugs  Current medications and supplements including opioid prescriptions. Patient is not currently taking opioid prescriptions. Functional ability and status Nutritional status Physical activity Advanced directives List of other physicians Hospitalizations, surgeries, and ER visits in previous 12 months Vitals Screenings to include cognitive, depression, and falls Referrals and appointments  In addition, I have reviewed and discussed with patient certain preventive protocols, quality metrics, and best practice recommendations. A written personalized care plan for preventive services as well as general preventive health recommendations were provided to patient.     Daphane Shepherd, LPN   [id number]/73/7106   Nurse Notes: None     Medical screening examination/treatment/procedure(s) were performed by  non-physician practitioner and as supervising physician I was immediately available for consultation/collaboration.  I agree with above. Lew Dawes, MD

## 2022-08-07 ENCOUNTER — Ambulatory Visit: Payer: Self-pay | Admitting: Licensed Clinical Social Worker

## 2022-08-07 NOTE — Patient Instructions (Signed)
Visit Information  Thank you for taking time to visit with me today. Please don't hesitate to contact me if I can be of assistance to you.   Following are the goals we discussed today:   Goals Addressed             This Visit's Progress    Care Coordination Activities       Care Coordination Interventions: Advised patient to review EMMI information provided Solution-Focused Strategies employed:  Provided general psycho-education for mental health needs  Participation in counseling encouraged : not ready at this time Provided EMMI education information on :Coping with a Health Condition and Depression. Discussed referral options to connect for ongoing therapy: willing to look at information below Caregiver stress acknowledged :    [facility], [place], [place] [address] Phone [phone] [email or web address]    [facility] [address] [place], [place] [address] Phone [phone] [email or web address]         Our next appointment is by telephone on 09/11/2022 at 9:00  Please call the care guide team at [phone] if you need to cancel or reschedule your appointment.   If you are experiencing a Mental Health or [place] or need someone to talk to, please call 1-800-273-TALK (toll free, 24 hour hotline)   Patient verbalizes understanding of instructions and care plan provided today and agrees to view in [place]. Active MyChart status and patient understanding of how to access instructions and care plan via MyChart confirmed with patient.      Casimer Lanius, [place] [phone]    Care Coordination team works in collaboration with your primary care doctor.  Please call [phone] if you would like to schedule a phone appointment   1.The Care Coordination services include support  from the care team which includes a Nurse Coordinator, Clinical Social Worker, or Pharmacist, to assist with navigating your physical and mental health needs. Marland Kitchen  2.The Care Coordination team is here to help remove barriers to health concerns and goals most important to you.  3.Care Coordination services are voluntary, you may decline or stop services at any time by request to the care team member

## 2022-08-07 NOTE — Patient Outreach (Signed)
  Care Coordination   Follow Up Visit Note   08/07/2022 Name: Natalie Fletcher MRN: [id number] DOB: 1951-09-29  Natalie Fletcher is a 71 y.o. year old female who sees Plotnikov, Evie Lacks, MD for primary care. I spoke with  [name] by phone today.  What matters to the patients health and wellness today?    Patient continues to experience stress and symptoms of depression which seems to be exacerbated by caring for her husband and other family stressor. She reports she is too busy to go to counseling .   Recommendation: Patient may benefit from, and is in agreement to Review educational information provided Call the therapy options discussed if needed Schedule 30 check in with LCSW.    Goals Addressed             This Visit's Progress    Care Coordination Activities       Care Coordination Interventions: Advised patient to review EMMI information provided Solution-Focused Strategies employed:  Provided general psycho-education for mental health needs  Participation in counseling encouraged : not ready at this time Provided EMMI education information on :Coping with a Health Condition and Depression. Discussed referral options to connect for ongoing therapy: willing to look at information below Caregiver stress acknowledged :    [facility], [place], [place] [address] Phone [phone] [email or web address]    [facility] [address] [place], [place] [address] Phone [phone] [email or web address]        SDOH assessments and interventions completed:  No    Care Coordination Interventions Activated:  Yes  Care Coordination Interventions:  Yes, provided   Follow up plan: Follow up call scheduled for 30 days    Encounter Outcome:  Pt. Visit Completed   Casimer Lanius, [place] [phone]

## 2022-08-10 ENCOUNTER — Telehealth: Payer: Self-pay

## 2022-08-10 NOTE — Telephone Encounter (Signed)
Patient states at her last visit she dropped off a pre-op form for surgery, Dr.Lucey's office ([facility]) still has yet to receive it. Patient is asking if we can re-fax this to them. Would like a call once done.

## 2022-08-10 NOTE — Telephone Encounter (Signed)
Notified pt form has been faxed back to Dr. Hart [place]

## 2022-09-11 ENCOUNTER — Ambulatory Visit: Payer: Self-pay | Admitting: Licensed Clinical Social Worker

## 2022-09-11 NOTE — Patient Instructions (Signed)
Visit Information  Thank you for taking time to visit with me today. Please don't hesitate to contact me if I can be of assistance to you.   Following are the goals we discussed today:   Goals Addressed             This Visit's Progress    Care Coordination Activities       Care Coordination Interventions: Advised patient to review EMMI information provided Motivational Interviewing employed Solution-Focused Strategies employed:  Mindfulness or Relaxation training provided Problem [place] strategies reviewed Participation in counseling encouraged : not ready at this time Completed last EMMI education  Provided EMMI education information on :Breathing to Relax; Relieving Stress and Managing Anxiety Around Daily Task Caregiver stress acknowledged :  Relaxed Breathing 3 times daily          Our next appointment is by telephone on 10/11/2022 at 9:30  Please call the care guide team at [phone] if you need to cancel or reschedule your appointment.   If you are experiencing a Mental Health or [place] or need someone to talk to, please call the Suicide and Crisis Lifeline: 988 call the [place] National Suicide Prevention Lifeline: (337)886-4110 or TTY: 770 110 3199 TTY 319-660-4996) to talk to a trained counselor call 1-800-273-TALK (toll free, 24 hour hotline)   Patient verbalizes understanding of instructions and care plan provided today and agrees to view in [place]. Active MyChart status and patient understanding of how to access instructions and care plan via MyChart confirmed with patient.     Casimer Lanius, [place] [phone]

## 2022-09-11 NOTE — Patient Outreach (Signed)
  Care Coordination  Follow Up Visit Note   09/11/2022 Name: Natalie Fletcher MRN: [id number] DOB: 1950-10-26  Natalie Fletcher is a 71 y.o. year old female who sees Natalie Fletcher, Natalie Lacks, MD for primary care. I spoke with  Natalie Fletcher by phone today.  What matters to the patients health and wellness today?  Supporting her husband  Patient continues to experience stress which seems to be exacerbated by caring for her husband..   Recommendation: Patient may benefit from, and is in agreement to Implement relaxed breathing and review additional EMMI educational information.    Goals Addressed             This Visit's Progress    Care Coordination Activities       Care Coordination Interventions: Advised patient to review EMMI information provided Motivational Interviewing employed Solution-Focused Strategies employed:  Mindfulness or Relaxation training provided Problem [place] strategies reviewed Participation in counseling encouraged : not ready at this time Completed last EMMI education  Provided EMMI education information on :Breathing to Relax; Relieving Stress and Managing Anxiety Around Daily Task Caregiver stress acknowledged :  Relaxed Breathing 3 times daily          SDOH assessments and interventions completed:  No     Care Coordination Interventions:  Yes, provided   Follow up plan: Follow up call scheduled for 30 days    Encounter Outcome:  Pt. Visit Completed   Natalie Fletcher, [place] [phone]

## 2022-09-18 ENCOUNTER — Encounter: Payer: Self-pay | Admitting: Gastroenterology

## 2022-09-25 ENCOUNTER — Ambulatory Visit
Admission: RE | Admit: 2022-09-25 | Discharge: 2022-09-25 | Disposition: A | Discharge status: Home / Self Care | Payer: Medicare Other | Source: Ambulatory Visit | Attending: Internal Medicine | Admitting: Internal Medicine

## 2022-09-25 DIAGNOSIS — Z1231 Encounter for screening mammogram for malignant neoplasm of breast: Secondary | ICD-10-CM

## 2022-10-11 ENCOUNTER — Ambulatory Visit: Payer: Self-pay | Admitting: Licensed Clinical Social Worker

## 2022-10-11 NOTE — Patient Instructions (Signed)
Visit Information  Thank you for taking time to visit with me today. Please don't hesitate to contact me if I can be of assistance to you.   Following are the goals we discussed today:   Goals Addressed             This Visit's Progress    Care Coordination Activities       Care Coordination Interventions:   Solution-Focused Strategies employed:  Mindfulness or Relaxation training provided Completed last EMMI education: Reviewed with patient  Provided EMMI education information on :Breathing to Relax; Relieving Stress and Movement: Emotional Health Assessed sleep: Reports no concerns at this time Caregiver stress acknowledged :  Continue Relaxed Breathing 3 times daily          Our next appointment is by telephone on Jan 30th at 1:15  Please call the care guide team at [phone] if you need to cancel or reschedule your appointment.   If you are experiencing a Mental Health or [place] or need someone to talk to, please call the Suicide and Crisis Lifeline: 988 call the [place] National Suicide Prevention Lifeline: 219-666-9707 or TTY: 2186933038 TTY (207) 172-3232) to talk to a trained counselor call 1-800-273-TALK (toll free, 24 hour hotline)   Patient verbalizes understanding of instructions and care plan provided today and agrees to view in [place]. Active MyChart status and patient understanding of how to access instructions and care plan via MyChart confirmed with patient.     Casimer Lanius, [place] [phone]

## 2022-10-11 NOTE — Patient Outreach (Signed)
  Care Coordination   Follow Up Visit Note   10/11/2022 Name: Natalie Fletcher MRN: [id number] DOB: 12-06-1950  Natalie Fletcher is a 71 y.o. year old female who sees Plotnikov, Evie Lacks, MD for primary care. I spoke with  Natalie Fletcher by phone today.  What matters to the patients health and wellness today?    Patient is making progress with reducing symptoms of stress LCSW  Reminded patient of all up coming appointments Recommendation: Patient may benefit from, and is in agreement to : implement interventions discussed to day and review EMMI educational informaton. .    Goals Addressed             This Visit's Progress    Care Coordination Activities       Care Coordination Interventions:   Solution-Focused Strategies employed:  Mindfulness or Relaxation training provided Completed last EMMI education: Reviewed with patient  Provided EMMI education information on :Breathing to Relax; Relieving Stress and Movement: Emotional Health Assessed sleep: Reports no concerns at this time Caregiver stress acknowledged :  Continue Relaxed Breathing 3 times daily          SDOH assessments and interventions completed:  No   Care Coordination Interventions:  Yes, provided   Follow up plan: Follow up call scheduled for 30 days    Encounter Outcome:  Pt. Visit Completed   Casimer Lanius, [place] [phone]

## 2022-10-22 ENCOUNTER — Emergency Department (HOSPITAL_COMMUNITY): Payer: Medicare Other

## 2022-10-22 ENCOUNTER — Other Ambulatory Visit: Payer: Self-pay

## 2022-10-22 ENCOUNTER — Emergency Department (HOSPITAL_COMMUNITY)
Admission: EM | Admit: 2022-10-22 | Discharge: 2022-10-23 | Disposition: A | Discharge status: Home / Self Care | Payer: Medicare Other | Attending: Emergency Medicine | Admitting: Emergency Medicine

## 2022-10-22 DIAGNOSIS — R569 Unspecified convulsions: Secondary | ICD-10-CM | POA: Insufficient documentation

## 2022-10-22 DIAGNOSIS — G8384 Todd's paralysis (postepileptic): Secondary | ICD-10-CM | POA: Diagnosis not present

## 2022-10-22 DIAGNOSIS — R531 Weakness: Secondary | ICD-10-CM | POA: Diagnosis present

## 2022-10-22 DIAGNOSIS — E876 Hypokalemia: Secondary | ICD-10-CM | POA: Diagnosis not present

## 2022-10-22 DIAGNOSIS — R7309 Other abnormal glucose: Secondary | ICD-10-CM

## 2022-10-22 LAB — CBC
HCT: 37.9 % (ref 36.0–46.0)
Hemoglobin: 12.3 g/dL (ref 12.0–15.0)
MCH: 30.6 pg (ref 26.0–34.0)
MCHC: 32.5 g/dL (ref 30.0–36.0)
MCV: 94.3 fL (ref 80.0–100.0)
Platelets: 254 10*3/uL (ref 150–400)
RBC: 4.02 MIL/uL (ref 3.87–5.11)
RDW: 14.6 % (ref 11.5–15.5)
WBC: 5.3 10*3/uL (ref 4.0–10.5)
nRBC: 0 % (ref 0.0–0.2)

## 2022-10-22 LAB — COMPREHENSIVE METABOLIC PANEL
ALT: 13 U/L (ref 0–44)
AST: 19 U/L (ref 15–41)
Albumin: 3.7 g/dL (ref 3.5–5.0)
Alkaline Phosphatase: 53 U/L (ref 38–126)
Anion gap: 13 (ref 5–15)
BUN: 13 mg/dL (ref 8–23)
CO2: 22 mmol/L (ref 22–32)
Calcium: 8.6 mg/dL — ABNORMAL LOW (ref 8.9–10.3)
Chloride: 101 mmol/L (ref 98–111)
Creatinine, Ser: 0.94 mg/dL (ref 0.44–1.00)
GFR, Estimated: >60 mL/min (ref >60)
Glucose, Bld: 111 mg/dL — ABNORMAL HIGH (ref 70–99)
Potassium: 3.3 mmol/L — ABNORMAL LOW (ref 3.5–5.1)
Sodium: 136 mmol/L (ref 135–145)
Total Bilirubin: 0.6 mg/dL (ref 0.3–1.2)
Total Protein: 5.8 g/dL — ABNORMAL LOW (ref 6.5–8.1)

## 2022-10-22 LAB — DIFFERENTIAL
Abs Immature Granulocytes: 0.01 10*3/uL (ref 0.00–0.07)
Basophils Absolute: 0 10*3/uL (ref 0.0–0.1)
Basophils Relative: 1 %
Eosinophils Absolute: 0.2 10*3/uL (ref 0.0–0.5)
Eosinophils Relative: 3 %
Immature Granulocytes: 0 %
Lymphocytes Relative: 41 %
Lymphs Abs: 2.2 10*3/uL (ref 0.7–4.0)
Monocytes Absolute: 0.5 10*3/uL (ref 0.1–1.0)
Monocytes Relative: 9 %
Neutro Abs: 2.4 10*3/uL (ref 1.7–7.7)
Neutrophils Relative %: 46 %

## 2022-10-22 LAB — I-STAT CHEM 8, ED
BUN: 14 mg/dL (ref 8–23)
Calcium, Ion: 0.94 mmol/L — ABNORMAL LOW (ref 1.15–1.40)
Chloride: 103 mmol/L (ref 98–111)
Creatinine, Ser: 1 mg/dL (ref 0.44–1.00)
Glucose, Bld: 108 mg/dL — ABNORMAL HIGH (ref 70–99)
HCT: 37 % (ref 36.0–46.0)
Hemoglobin: 12.6 g/dL (ref 12.0–15.0)
Potassium: 3.4 mmol/L — ABNORMAL LOW (ref 3.5–5.1)
Sodium: 136 mmol/L (ref 135–145)
TCO2: 24 mmol/L (ref 22–32)

## 2022-10-22 LAB — ETHANOL: Alcohol, Ethyl (B): 50 mg/dL — ABNORMAL HIGH (ref <10)

## 2022-10-22 LAB — CBG MONITORING, ED: Glucose-Capillary: 112 mg/dL — ABNORMAL HIGH (ref 70–99)

## 2022-10-22 LAB — PROTIME-INR
INR: 1 (ref 0.8–1.2)
Prothrombin Time: 13 seconds (ref 11.4–15.2)

## 2022-10-22 LAB — APTT: aPTT: 25 seconds (ref 24–36)

## 2022-10-22 MED ORDER — SODIUM CHLORIDE 0.9% FLUSH: 3.0000 mL | Freq: Once | INTRAVENOUS | Status: DC

## 2022-10-22 NOTE — ED Provider Notes (Signed)
[facility]ENC[facility] DEPARTMENT Provider Note   CSN: [id number] Arrival date & time: 10/22/22  2307  An emergency department physician performed an initial assessment on this suspected stroke patient at 2314.  History Chief complaint: Code stroke  Natalie Fletcher is a 72 y.o. female.  The history is provided by the EMS personnel and the patient. The history is limited by the condition of the patient (Altered mental status).  She has history of GERD, anemia, depression and was brought in by ambulance as a code stroke.  At home she was reported to have urinated on herself and she was having slurred speech and right-sided weakness.  EMS reports that her condition has markedly improved during transport.  Patient does admit to having had 2 shots of vodka tonight but states that that is not a usual thing for her.  She denies any drug use.  She has no current complaints.   Home Medications Prior to Admission medications   Medication Sig Start Date End Date Taking? Authorizing Provider  B Complex-Folic Acid (B COMPLEX PLUS) TABS Take 1 tablet by mouth daily. 04/24/22   Plotnikov, Evie Lacks, MD  [employer] D3 50 MCG (2000 UT) CAPS TAKE 1 CAPSULE BY MOUTH EVERY DAY NEED OFFICE VISIT 02/17/21   Plotnikov, Evie Lacks, MD  FLUoxetine (PROZAC) 10 MG capsule Take 1 capsule (10 mg total) by mouth daily. 04/24/22   Plotnikov, Evie Lacks, MD      Allergies    Codeine    Review of Systems   Review of Systems  All other systems reviewed and are negative.   Physical Exam Updated Vital Signs BP (!) 144/79   Pulse 61   Temp (!) 97.4 F (36.3 C) (Oral)   Resp 11   Ht '5\' 2"'$  (1.575 m)   Wt 68 kg   SpO2 100%   BMI 27.44 kg/m   Physical Exam Vitals and nursing note reviewed.   72 year old female, resting comfortably and in no acute distress. Vital signs are significant for mildly elevated blood pressure. Oxygen saturation is 100%, which is normal. Head is normocephalic and atraumatic.  PERRLA, EOMI. Oropharynx is clear. Neck is nontender and supple without adenopathy or JVD. Back is nontender and there is no CVA tenderness. Lungs are clear without rales, wheezes, or rhonchi. Chest is nontender. Heart has regular rate and rhythm without murmur. Abdomen is soft, flat, nontender. Extremities have no cyanosis or edema, full range of motion is present. Skin is warm and dry without rash. Neurologic: Awake and alert, oriented x 3.  Speech spontaneous and fluent and appropriate.  Cranial nerves are intact.  Strength is 5/5 in all 4 extremities.  There is no pronator drift.  There is no extinction on double simultaneous stimulation.  Coordination is normal.  ED Results / Procedures / Treatments   Labs (all labs ordered are listed, but only abnormal results are displayed) Labs Reviewed  I-STAT CHEM 8, ED - Abnormal; Notable for the following components:      Result Value   Potassium 3.4 (*)    Glucose, Bld 108 (*)    Calcium, Ion 0.94 (*)    All other components within normal limits  CBG MONITORING, ED - Abnormal; Notable for the following components:   Glucose-Capillary 112 (*)    All other components within normal limits  CBC  DIFFERENTIAL  PROTIME-INR  APTT  COMPREHENSIVE METABOLIC PANEL  ETHANOL  LACTIC ACID, PLASMA  CK  URINALYSIS, ROUTINE W REFLEX MICROSCOPIC  RAPID URINE DRUG SCREEN, HOSP PERFORMED    EKG EKG Interpretation  Date/Time:  Sunday October 22 2022 23:29:00 EST Ventricular Rate:  60 PR Interval:  152 QRS Duration: 103 QT Interval:  496 QTC Calculation: 496 R Axis:   -4 Text Interpretation: Sinus rhythm Left ventricular hypertrophy Borderline T abnormalities, anterior leads Borderline prolonged QT interval No old tracing to compare Confirmed by Delora Fuel ([id number]) on 10/22/2022 11:32:53 PM  Radiology No results found.  Procedures Procedures  Cardiac monitor shows normal sinus rhythm, per my interpretation.  Medications Ordered in  ED Medications  sodium chloride flush (NS) 0.9 % injection 3 mL (has no administration in time range)    ED Course/ Medical Decision Making/ A&P                           Medical Decision Making Amount and/or Complexity of Data Reviewed Labs: ordered.   Episode of urinary incontinence with transient right-sided weakness and speech disturbance.  Constellation of symptoms seems most consistent with seizure with postictal phase and Todd's paralysis.  Doubt TIA.  She was taken for emergent CT scan which shows no evidence of bleeding per my interpretation, full radiologist interpretation pending.  Code stroke labs have been ordered, I have added CK and lactic acid and drug screen.  I have reviewed and interpreted her electrocardiogram, and my interpretation is borderline prolonged QT interval, borderline T wave flattening, no prior ECG available for comparison.  CRITICAL CARE Performed by: Delora Fuel Total critical care time: 73 minutes Critical care time was exclusive of separately billable procedures and treating other patients. Critical care was necessary to treat or prevent imminent or life-threatening deterioration. Critical care was time spent personally by me on the following activities: development of treatment plan with patient and/or surrogate as well as nursing, discussions with consultants, evaluation of patient's response to treatment, examination of patient, obtaining history from patient or surrogate, ordering and performing treatments and interventions, ordering and review of laboratory studies, ordering and review of radiographic studies, pulse oximetry and re-evaluation of patient's condition.  Final Clinical Impression(s) / ED Diagnoses Final diagnoses:  None    Rx / DC Orders ED Discharge Orders     None

## 2022-10-22 NOTE — Code Documentation (Signed)
Responded to Code Stroke called at 8295 for AMS, R sided weakness, slurred speech, and urinary incontinence, LSN-[id number]. Pt arrived at 2307, CBG-112, NIH-1, CT head negative. TNK not given. Plan seizure w/o.

## 2022-10-22 NOTE — ED Triage Notes (Signed)
Pt bib [facility] from home as a code stroke after witnessed syncopal episode. Pt walking from bathroom to kitchen when she passed out, helped to bed by husband, became aphasic and husband noted pt urinated on herself. Upon arrival to ED, pt no longer aphasic, slightly confused.   BP 138/68 HR 65 SPO2 96% RA CBG 138

## 2022-10-23 ENCOUNTER — Emergency Department (HOSPITAL_COMMUNITY): Payer: Medicare Other

## 2022-10-23 DIAGNOSIS — R569 Unspecified convulsions: Secondary | ICD-10-CM

## 2022-10-23 LAB — URINALYSIS, ROUTINE W REFLEX MICROSCOPIC
Bilirubin Urine: NEGATIVE
Glucose, UA: NEGATIVE mg/dL
Hgb urine dipstick: NEGATIVE
Ketones, ur: NEGATIVE mg/dL
Leukocytes,Ua: NEGATIVE
Nitrite: NEGATIVE
Protein, ur: NEGATIVE mg/dL
Specific Gravity, Urine: 1.005 (ref 1.005–1.030)
pH: 6 (ref 5.0–8.0)

## 2022-10-23 LAB — CK: Total CK: 94 U/L (ref 38–234)

## 2022-10-23 LAB — RAPID URINE DRUG SCREEN, HOSP PERFORMED
Amphetamines: NOT DETECTED
Barbiturates: NOT DETECTED
Benzodiazepines: NOT DETECTED
Cocaine: NOT DETECTED
Opiates: NOT DETECTED
Tetrahydrocannabinol: NOT DETECTED

## 2022-10-23 LAB — LACTIC ACID, PLASMA: Lactic Acid, Venous: 1.9 mmol/L (ref 0.5–1.9)

## 2022-10-23 MED ORDER — POTASSIUM CHLORIDE CRYS ER 20 MEQ PO TBCR
40.0000 meq | EXTENDED_RELEASE_TABLET | Freq: Once | ORAL | Status: AC
  Administered 2022-10-23: 40 meq via ORAL
  Filled 2022-10-23: qty 2

## 2022-10-23 MED ORDER — POTASSIUM CHLORIDE CRYS ER 20 MEQ PO TBCR: 20.0000 meq | EXTENDED_RELEASE_TABLET | Freq: Two times a day (BID) | ORAL | 0 refills | Status: AC

## 2022-10-23 NOTE — ED Notes (Signed)
Patient transported to MRI 

## 2022-10-23 NOTE — Consult Note (Signed)
NEUROLOGY CONSULTATION NOTE   Date of service: October 23, 2022 Patient Name: Natalie Fletcher MRN:  [id number] DOB:  Feb 18, 1951 Reason for consult: "stroke code for concern for R sided weakness" Requesting Provider: Delora Fuel, MD _ _ _   _ __   _ __ _ _  __ __   _ __   __ _  History of Present Illness  [name] is a 72 y.o. 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 She then passed out and unresponsive and urinated on herself. EMS called and she was kind of coming back when EMS got there. She had urinated on herself. Speech was slurred and she was weaker on the right than the left and was dragging her right foot. EMS noted enroute that speech was garbled and not making much sense.  Symptoms had pretty much resolved by the time of arrival. She was slightly confused on arrival. Patient has no recollection of the episode. She was surprised upon learning that she is at the hospital.  No hypotension noted by EMS. Systolic was 144R on their initial evaluation and 130s enroute with no tachycardia.  I spoke with patient's daughter and sister-in-law at the bedside.  They report that patient drinks about 14 ounces of vodka a day.  She recently cut down to about 7 ounces and did have about 7 ounces of vodka last night.  No prior history of seizures, no family history of seizures, no recent head injury or loss of consciousness.  No history of strokes or ICH.  No history of tumors or cancers of the brain.  LKW: 2200 on 10/22/2022. mRS: 0 tNKASE: Not offered due to resolution of symptoms and low concern for stroke or more for seizure. Thrombectomy: Not offered due to resolution of symptoms and low concern for stroke. NIHSS components Score: Comment  1a Level of Conscious 0'[x]'$  1'[]'$  2'[]'$  3'[]'$      1b LOC Questions 0'[x]'$  1'[]'$  2'[]'$     Initially reported she is 72 but later  corrected herself and tells me she is 72.  1c LOC Commands 0'[x]'$  1'[]'$  2'[]'$       2 Best Gaze 0'[x]'$  1'[]'$  2'[]'$       3 Visual 0'[x]'$  1'[]'$  2'[]'$  3'[]'$      4 Facial Palsy 0'[x]'$  1'[]'$  2'[]'$  3'[]'$      5a Motor Arm - left 0'[x]'$  1'[]'$  2'[]'$  3'[]'$  4'[]'$  UN'[]'$    5b Motor Arm - Right 0'[x]'$  1'[]'$  2'[]'$  3'[]'$  4'[]'$  UN'[]'$    6a Motor Leg - Left 0'[x]'$  1'[]'$  2'[]'$  3'[]'$  4'[]'$  UN'[]'$    6b Motor Leg - Right 0'[x]'$  1'[]'$  2'[]'$  3'[]'$  4'[]'$  UN'[]'$    7 Limb Ataxia 0'[x]'$  1'[]'$  2'[]'$  3'[]'$  UN'[]'$     8 Sensory 0'[x]'$  1'[]'$  2'[]'$  UN'[]'$      9 Best Language 0'[x]'$  1'[]'$  2'[]'$  3'[]'$      10 Dysarthria 0'[x]'$  1'[]'$  2'[]'$  UN'[]'$      11 Extinct. and Inattention 0'[x]'$  1'[]'$  2'[]'$       TOTAL: 0        ROS   Constitutional Denies weight loss, fever and chills.   HEENT Denies changes in vision and hearing.   Respiratory Denies SOB and cough.   CV Denies palpitations and CP   GI Denies abdominal pain, nausea, vomiting and diarrhea.   GU Denies dysuria and urinary frequency.   MSK Denies myalgia and joint pain.   Skin Denies rash  and pruritus.   Neurological Denies headache and syncope.   Psychiatric Denies recent changes in mood. Denies anxiety and depression.    Past History   Past Medical History:  Diagnosis Date   Anemia    Arthritis    Depression    GERD (gastroesophageal reflux disease)    Past Surgical History:  Procedure Laterality Date   COLONOSCOPY  12/2007   PARTIAL KNEE ARTHROPLASTY  05/20/2012   Procedure: UNICOMPARTMENTAL KNEE;  Surgeon: Rudean Haskell, MD;  Location: [facility];  Service: Orthopedics;  Laterality: Right;  Uni Medial Compartmental Right Knee    Family History  Problem Relation Age of Onset   Kidney cancer Mother 91       kidney ca   COPD Father    Kidney cancer Maternal Grandmother 45       kidney ca   Breast cancer Maternal Aunt    Colon cancer Neg Hx    Rectal cancer Neg Hx    Stomach cancer Neg Hx    Social History   Socioeconomic History   Marital status: Married    Spouse name: Not on file   Number of children: 2   Years of education: Not on file   Highest  education level: Not on file  Occupational History    Employer: [employer]  Tobacco Use   Smoking status: Never   Smokeless tobacco: Never  Vaping Use   Vaping Use: Never used  Substance and Sexual Activity   Alcohol use: Yes    Alcohol/week: 1.0 standard drink of alcohol    Types: 1 Glasses of wine per week    Comment: occasionally   Drug use: No   Sexual activity: Yes    Birth control/protection: Post-menopausal  Other Topics Concern   Not on file  Social History Narrative   Not on file   Social Determinants of Health   Financial Resource Strain: Low Risk  (07/28/2022)   Overall Financial Resource Strain (CARDIA)    Difficulty of Paying Living Expenses: Not hard at all  Food Insecurity: No Food Insecurity (07/28/2022)   Hunger Vital Sign    Worried About Running Out of Food in the Last Year: Never true    Ran Out of Food in the Last Year: Never true  Transportation Needs: No Transportation Needs (07/28/2022)   PRAPARE - Hydrologist (Medical): No    Lack of Transportation (Non-Medical): No  Physical Activity: Insufficiently Active (07/28/2022)   Exercise Vital Sign    Days of Exercise per Week: 3 days    Minutes of Exercise per Session: 30 min  Stress: No Stress Concern Present (07/28/2022)   [place]    Feeling of Stress : Not at all  Recent Concern: Stress - Stress Concern Present (07/24/2022)   [place]    Feeling of Stress : To some extent  Social Connections: Socially Integrated (07/28/2022)   Social Connection and Isolation Panel [NHANES]    Frequency of Communication with Friends and Family: More than three times a week    Frequency of Social Gatherings with Friends and Family: More than three times a week    Attends Religious Services: More than 4 times per year    Active Member of [employer] or  Organizations: Yes    Attends Music therapist: More than 4 times per year    Marital Status: Married  Allergies  Allergen Reactions   Codeine Nausea And Vomiting    Medications  (Not in a hospital admission)    Vitals   Vitals:   10/22/22 2345 10/23/22 0000 10/23/22 0030 10/23/22 0045  BP: (!) 155/89 (!) 129/92 130/67 118/64  Pulse: 62 63 72 72  Resp: 20 19 (!) 21 19  Temp:      TempSrc:      SpO2: 100% 97% 94% 95%  Weight:      Height:         Body mass index is 27.44 kg/m.  Physical Exam   General: Laying comfortably in bed; in no acute distress.  HENT: Normal oropharynx and mucosa. Normal external appearance of ears and nose.  Neck: Supple, no pain or tenderness  CV: No JVD. No peripheral edema.  Pulmonary: Symmetric Chest rise. Normal respiratory effort.  Abdomen: Soft to touch, non-tender.  Ext: No cyanosis, edema, or deformity  Skin: No rash. Normal palpation of skin.   Musculoskeletal: Normal digits and nails by inspection. No clubbing.   Neurologic Examination  Mental status/Cognition: Alert, oriented to self, place, month and year, good attention.  Speech/language: Fluent, comprehension intact, object naming intact, repetition intact.  Cranial nerves:   CN II Pupils equal and reactive to light, no VF deficits    CN III,IV,VI EOM intact, no gaze preference or deviation, no nystagmus   CN V normal sensation in V1, V2, and V3 segments bilaterally    CN VII no asymmetry, no nasolabial fold flattening    CN VIII normal hearing to speech    CN IX & X normal palatal elevation, no uvular deviation    CN XI 5/5 head turn and 5/5 shoulder shrug bilaterally    CN XII midline tongue protrusion    Motor:  Muscle bulk: normal, tone normal. Mvmt Root Nerve  Muscle Right Left Comments  SA C5/6 Ax Deltoid 5 5   EF C5/6 Mc Biceps 5 5   EE C6/7/8 Rad Triceps 5 5   WF C6/7 Med FCR     WE C7/8 PIN ECU     F Ab C8/T1 U ADM/FDI 5 5   HF L1/2/3 Fem  Illopsoas 5 5   KE L2/3/4 Fem Quad 5 5   DF L4/5 D Peron Tib Ant 5 5   PF S1/2 Tibial Grc/Sol 5 5    Sensation:  Light touch Intact throughout   Pin prick    Temperature    Vibration   Proprioception    Coordination/Complex Motor:  - Finger to Nose intact bilaterally - Heel to shin intact bilaterally - Rapid alternating movement are normal - Gait: deferred.  Labs   CBC:  Recent Labs  Lab 10/22/22 2311 10/22/22 2315  WBC 5.3  --   NEUTROABS 2.4  --   HGB 12.3 12.6  HCT 37.9 37.0  MCV 94.3  --   PLT 254  --     Basic Metabolic Panel:  Lab Results  Component Value Date   NA 136 10/22/2022   K 3.4 (L) 10/22/2022   CO2 22 10/22/2022   GLUCOSE 108 (H) 10/22/2022   BUN 14 10/22/2022   CREATININE 1.00 10/22/2022   CALCIUM 8.6 (L) 10/22/2022   GFRNONAA >60 10/22/2022   GFRAA >90 05/21/2012   Lipid Panel:  Lab Results  Component Value Date   LDLCALC 137 (H) 04/24/2022   HgbA1c: No results found for: "HGBA1C" Urine Drug Screen: No results found for: "LABOPIA", "COCAINSCRNUR", "LABBENZ", "AMPHETMU", "THCU", "LABBARB"  Alcohol Level     Component Value Date/Time   ETH 50 (H) 10/22/2022 2311    CT Head without contrast(Personally reviewed): CTH was negative for a large hypodensity concerning for a large territory infarct or hyperdensity concerning for an ICH  MRI Brain: pending  rEEG:  pending  Impression   JANIAYA RYSER is a 72 y.o. 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 She was very somnolent afterwards and weak on the right side and not making much sense in the ambulance. She has no recollection of the event but is now back to her baseline with no deficit.  Presentation concerning for likely left hemispheric seizure with aphasia and post ictal right sided weakness. Etiology of seizure is not entirely clear. Cutting  down on EtOH recently could be contributing. EtOH levels here are elevated to 50. Could consider withdrawal seizures but would expect withdrawal seizure to be generalized rather than focal.  No meningismus, vital and CBC, chemistry non revealing. UDS pending.  Recommendations  - MRI Brain without contrast. - routine EEG, can be done outpatient. - UDS - counseled her on not using EtOH going forward but if she does end up back on alcohol, she should not quit EtOH at home and should seek inpatient rehab or come to the hospital. - no driving for 6 months. Has to be seizure free before she can resume driving. - follow up with neurology outpatient. - hold off on AEDs at this time. ______________________________________________________________________  Thank you for the opportunity to take part in the care of this patient. If you have any further questions, please contact the neurology consultation attending.  Signed,  [facility] Pager Number [phone] _ _ _   _ __   _ __ _ _  __ __   _ __   __ _

## 2022-10-23 NOTE — ED Notes (Signed)
RN reviewed discharge instructions with pt. Pt verbalized understanding and had no further questions. VSS upon discharge.  

## 2022-10-23 NOTE — Discharge Instructions (Addendum)
You apparently had a seizure today which caused transient weakness of your right arm and transient difficulty speaking.  Your evaluation did not show anything serious, but you should follow-up with the neurologist to obtain an EEG (brainwave study).  This test is mainly to see if you need to be on medication to prevent future seizures.  Please be aware that the law in [place] is that you may not drive if you have had a seizure in the past 6 months.

## 2022-10-23 NOTE — ED Provider Notes (Incomplete)
[facility] EMERGENCY DEPARTMENT Provider Note   CSN: [id number] Arrival date & time: 10/22/22  2307  An emergency department physician performed an initial assessment on this suspected stroke patient at 2314.  History Chief complaint: Code stroke  ELLIE BRYAND is a 72 y.o. female.  The history is provided by the EMS personnel and the patient. The history is limited by the condition of the patient (Altered mental status).  She has history of GERD, anemia, depression and was brought in by ambulance as a code stroke.  At home she was reported to have urinated on herself and she was having slurred speech and right-sided weakness.  EMS reports that her condition has markedly improved during transport.  Patient does admit to having had 2 shots of vodka tonight but states that that is not a usual thing for her.  She denies any drug use.  She has no current complaints.   Home Medications Prior to Admission medications   Medication Sig Start Date End Date Taking? Authorizing Provider  B Complex-Folic Acid (B COMPLEX PLUS) TABS Take 1 tablet by mouth daily. 04/24/22   Plotnikov, Evie Lacks, MD  CVS D3 50 MCG (2000 UT) CAPS TAKE 1 CAPSULE BY MOUTH EVERY DAY NEED OFFICE VISIT 02/17/21   Plotnikov, Evie Lacks, MD  FLUoxetine (PROZAC) 10 MG capsule Take 1 capsule (10 mg total) by mouth daily. 04/24/22   Plotnikov, Evie Lacks, MD      Allergies    Codeine    Review of Systems   Review of Systems  All other systems reviewed and are negative.   Physical Exam Updated Vital Signs BP (!) 144/79   Pulse 61   Temp (!) 97.4 F (36.3 C) (Oral)   Resp 11   Ht '5\' 2"'$  (1.575 m)   Wt 68 kg   SpO2 100%   BMI 27.44 kg/m   Physical Exam Vitals and nursing note reviewed.   72 year old female, resting comfortably and in no acute distress. Vital signs are significant for mildly elevated blood pressure. Oxygen saturation is 100%, which is normal. Head is normocephalic and atraumatic.  PERRLA, EOMI. Oropharynx is clear. Neck is nontender and supple without adenopathy or JVD. Back is nontender and there is no CVA tenderness. Lungs are clear without rales, wheezes, or rhonchi. Chest is nontender. Heart has regular rate and rhythm without murmur. Abdomen is soft, flat, nontender. Extremities have no cyanosis or edema, full range of motion is present. Skin is warm and dry without rash. Neurologic: Awake and alert, oriented x 3.  Speech spontaneous and fluent and appropriate.  Cranial nerves are intact.  Strength is 5/5 in all 4 extremities.  There is no pronator drift.  There is no extinction on double simultaneous stimulation.  Coordination is normal.  ED Results / Procedures / Treatments   Labs (all labs ordered are listed, but only abnormal results are displayed) Labs Reviewed  I-STAT CHEM 8, ED - Abnormal; Notable for the following components:      Result Value   Potassium 3.4 (*)    Glucose, Bld 108 (*)    Calcium, Ion 0.94 (*)    All other components within normal limits  CBG MONITORING, ED - Abnormal; Notable for the following components:   Glucose-Capillary 112 (*)    All other components within normal limits  CBC  DIFFERENTIAL  PROTIME-INR  APTT  COMPREHENSIVE METABOLIC PANEL  ETHANOL  LACTIC ACID, PLASMA  CK  URINALYSIS, ROUTINE W REFLEX MICROSCOPIC  RAPID URINE DRUG SCREEN, HOSP PERFORMED    EKG EKG Interpretation  Date/Time:  Sunday October 22 2022 23:29:00 EST Ventricular Rate:  60 PR Interval:  152 QRS Duration: 103 QT Interval:  496 QTC Calculation: 496 R Axis:   -4 Text Interpretation: Sinus rhythm Left ventricular hypertrophy Borderline T abnormalities, anterior leads Borderline prolonged QT interval No old tracing to compare Confirmed by Delora Fuel ([id number]) on 10/22/2022 11:32:53 PM  Radiology No results found.  Procedures Procedures  Cardiac monitor shows normal sinus rhythm, per my interpretation.  Medications Ordered in  ED Medications  sodium chloride flush (NS) 0.9 % injection 3 mL (has no administration in time range)    ED Course/ Medical Decision Making/ A&P                           Medical Decision Making Amount and/or Complexity of Data Reviewed Labs: ordered. Radiology: ordered.   Episode of urinary incontinence with transient right-sided weakness and speech disturbance.  Constellation of symptoms seems most consistent with seizure with postictal phase and Todd's paralysis.  Doubt TIA.  She was taken for emergent CT scan which shows no evidence of bleeding per my interpretation, full radiologist interpretation pending.  Code stroke labs have been ordered, I have added CK and lactic acid and drug screen.  I have reviewed and interpreted her electrocardiogram, and my interpretation is borderline prolonged QT interval, borderline T wave flattening, no prior ECG available for comparison.  CRITICAL CARE Performed by: Delora Fuel Total critical care time: 73 minutes Critical care time was exclusive of separately billable procedures and treating other patients. Critical care was necessary to treat or prevent imminent or life-threatening deterioration. Critical care was time spent personally by me on the following activities: development of treatment plan with patient and/or surrogate as well as nursing, discussions with consultants, evaluation of patient's response to treatment, examination of patient, obtaining history from patient or surrogate, ordering and performing treatments and interventions, ordering and review of laboratory studies, ordering and review of radiographic studies, pulse oximetry and re-evaluation of patient's condition.  Final Clinical Impression(s) / ED Diagnoses Final diagnoses:  None    Rx / DC Orders ED Discharge Orders     None

## 2022-10-26 ENCOUNTER — Telehealth: Payer: Self-pay

## 2022-10-26 ENCOUNTER — Encounter: Payer: Self-pay | Admitting: Neurology

## 2022-10-26 ENCOUNTER — Ambulatory Visit (INDEPENDENT_AMBULATORY_CARE_PROVIDER_SITE_OTHER): Payer: Medicare Other | Admitting: Neurology

## 2022-10-26 VITALS — BP 170/81 | HR 61 | Ht 62.0 in | Wt 153.0 lb

## 2022-10-26 DIAGNOSIS — R569 Unspecified convulsions: Secondary | ICD-10-CM | POA: Diagnosis not present

## 2022-10-26 NOTE — Telephone Encounter (Signed)
        Patient  visited [facility] on 10/23/2022  for seizure.   Telephone encounter attempt :  1st  A HIPAA compliant voice message was left requesting a return call.  Instructed patient to call back at [phone].   [facility] Resource Care Guide   [email or web address]  ?? [phone]   Website: [email or web address]  [email or web address]

## 2022-10-26 NOTE — Patient Instructions (Signed)
Routine EEG, I will contact you to go over the result Continue other medication Discussed driving restriction for next 6 months Follow-up with your PCP Return as needed

## 2022-10-26 NOTE — Progress Notes (Signed)
[facility]  PATIENT: Natalie Fletcher DOB: October 04, 1951  REQUESTING CLINICIAN: Plotnikov, Evie Lacks, MD HISTORY FROM: Patient and daughter  REASON FOR VISIT: Seizure    HISTORICAL  CHIEF COMPLAINT:  Chief Complaint  Patient presents with   New Patient (Initial Visit)    Rm 92, with daughter  NP/internal ED referral for new onset seizure,  States she is feeling well back to baseline    HISTORY OF PRESENT ILLNESS:  This is a 72 year old woman past medical history of depression, GERD, alcohol use who is presenting after being seen in the hospital on January 7 for possible seizure.  Patient stated that she remembers sitting at home watching the TV and the next thing that she remembers is leaving the hospital, her hospital stay was not clear to her; she still does not have a lot of recollection.  Per chart review patient fell in her kitchen and was not acting right, there was report that patient was unresponsive and she urinated on herself.  She also have slurred speech, right-sided weakness and confusion.  In the ED, they rule out stroke with MRI and patient was thought to have a seizure, possibly related to alcohol.  Per chart review patient has significant alcohol use, used to drink 14 ounce of vodka every day but cut down to 7 ounce recently.  Her alcohol level was 50. In regards to alcohol, patient is adamant that she does not drink every day and on that day she only had 2 glasses of alcohol. She denies any previous history of seizure or seizure lately.  No previous history of syncope.  Denies any seizure risk factors.  Handedness: Right   Onset: Jan 7  Seizure Type: Fall, aphasia and confusion   Current frequency: Only once  Any injuries from seizures: Bruise   Seizure risk factors: Alcohol use   Previous ASMs: None  Currenty ASMs: None   ASMs side effects: N/A   Brain Images: No acute abnormality  Previous EEGs: none    OTHER MEDICAL CONDITIONS:  Depression, alcohol use  REVIEW OF SYSTEMS: Full 14 system review of systems performed and negative with exception of: As noted in the HPI   ALLERGIES: Allergies  Allergen Reactions   Codeine Nausea And Vomiting    HOME MEDICATIONS: Outpatient Medications Prior to Visit  Medication Sig Dispense Refill   FLUoxetine (PROZAC) 10 MG capsule Take 1 capsule (10 mg total) by mouth daily. 90 capsule 3   OVER THE COUNTER MEDICATION Take 1 capsule by mouth daily. [employer] Mushroom for memory and focus     potassium chloride SA (KLOR-CON M) 20 MEQ tablet Take 1 tablet (20 mEq total) by mouth 2 (two) times daily. 10 tablet 0   No facility-administered medications prior to visit.    PAST MEDICAL HISTORY: Past Medical History:  Diagnosis Date   Anemia    Arthritis    Depression    GERD (gastroesophageal reflux disease)     PAST SURGICAL HISTORY: Past Surgical History:  Procedure Laterality Date   COLONOSCOPY  12/2007   PARTIAL KNEE ARTHROPLASTY  05/20/2012   Procedure: UNICOMPARTMENTAL KNEE;  Surgeon: Rudean Haskell, MD;  Location: [facility];  Service: Orthopedics;  Laterality: Right;  Uni Medial Compartmental Right Knee     FAMILY HISTORY: Family History  Problem Relation Age of Onset   Kidney cancer Mother 60       kidney ca   COPD Father    Kidney cancer Maternal Grandmother 82  kidney ca   Breast cancer Maternal Aunt    Colon cancer Neg Hx    Rectal cancer Neg Hx    Stomach cancer Neg Hx     SOCIAL HISTORY: Social History   Socioeconomic History   Marital status: Married    Spouse name: Not on file   Number of children: 2   Years of education: Not on file   Highest education level: Not on file  Occupational History    Employer: [employer]  Tobacco Use   Smoking status: Never   Smokeless tobacco: Never  Vaping Use   Vaping Use: Never used  Substance and Sexual Activity   Alcohol use: Yes    Alcohol/week: 1.0 standard drink of alcohol    Types: 1 Glasses of  wine per week    Comment: occasionally   Drug use: No   Sexual activity: Yes    Birth control/protection: Post-menopausal  Other Topics Concern   Not on file  Social History Narrative   Not on file   Social Determinants of Health   Financial Resource Strain: Low Risk  (07/28/2022)   Overall Financial Resource Strain (CARDIA)    Difficulty of Paying Living Expenses: Not hard at all  Food Insecurity: No Food Insecurity (07/28/2022)   Hunger Vital Sign    Worried About Running Out of Food in the Last Year: Never true    Ran Out of Food in the Last Year: Never true  Transportation Needs: No Transportation Needs (07/28/2022)   PRAPARE - Hydrologist (Medical): No    Lack of Transportation (Non-Medical): No  Physical Activity: Insufficiently Active (07/28/2022)   Exercise Vital Sign    Days of Exercise per Week: 3 days    Minutes of Exercise per Session: 30 min  Stress: No Stress Concern Present (07/28/2022)   [place]    Feeling of Stress : Not at all  Recent Concern: Stress - Stress Concern Present (07/24/2022)   [place]    Feeling of Stress : To some extent  Social Connections: Socially Integrated (07/28/2022)   Social Connection and Isolation Panel [NHANES]    Frequency of Communication with Friends and Family: More than three times a week    Frequency of Social Gatherings with Friends and Family: More than three times a week    Attends Religious Services: More than 4 times per year    Active Member of [employer] or Organizations: Yes    Attends Archivist Meetings: More than 4 times per year    Marital Status: Married  Human resources officer Violence: Not At Risk (07/28/2022)   Humiliation, Afraid, Rape, and Kick questionnaire    Fear of Current or Ex-Partner: No    Emotionally Abused: No    Physically Abused: No     Sexually Abused: No    PHYSICAL EXAM  GENERAL EXAM/CONSTITUTIONAL: Vitals:  Vitals:   10/26/22 0847  BP: (!) 170/81  Pulse: 61  Weight: 153 lb (69.4 kg)  Height: '5\' 2"'$  (1.575 m)   Body mass index is 27.98 kg/m. Wt Readings from Last 3 Encounters:  10/26/22 153 lb (69.4 kg)  10/22/22 150 lb (68 kg)  07/28/22 144 lb (65.3 kg)   Patient is in no distress; well developed, nourished and groomed; neck is supple  EYES: Visual fields full to confrontation, Extraocular movements intacts,  No results found.  MUSCULOSKELETAL: Gait, strength, tone,  movements noted in Neurologic exam below  NEUROLOGIC: MENTAL STATUS:      No data to display         awake, alert, oriented to person, place and time recent and remote memory intact normal attention and concentration language fluent, comprehension intact, naming intact fund of knowledge appropriate  CRANIAL NERVE:  2nd, 3rd, 4th, 6th - Visual fields full to confrontation, extraocular muscles intact, no nystagmus 5th - facial sensation symmetric 7th - facial strength symmetric 8th - hearing intact 9th - palate elevates symmetrically, uvula midline 11th - shoulder shrug symmetric 12th - tongue protrusion midline  MOTOR:  normal bulk and tone, full strength in the BUE, BLE  SENSORY:  normal and symmetric to light touch  COORDINATION:  finger-nose-finger, fine finger movements normal  GAIT/STATION:  normal     DIAGNOSTIC DATA (LABS, IMAGING, TESTING) - I reviewed patient records, labs, notes, testing and imaging myself where available.  Lab Results  Component Value Date   WBC 5.3 10/22/2022   HGB 12.6 10/22/2022   HCT 37.0 10/22/2022   MCV 94.3 10/22/2022   PLT 254 10/22/2022      Component Value Date/Time   NA 136 10/22/2022 2315   K 3.4 (L) 10/22/2022 2315   CL 103 10/22/2022 2315   CO2 22 10/22/2022 2311   GLUCOSE 108 (H) 10/22/2022 2315   BUN 14 10/22/2022 2315   CREATININE 1.00 10/22/2022 2315    CALCIUM 8.6 (L) 10/22/2022 2311   PROT 5.8 (L) 10/22/2022 2311   ALBUMIN 3.7 10/22/2022 2311   AST 19 10/22/2022 2311   ALT 13 10/22/2022 2311   ALKPHOS 53 10/22/2022 2311   BILITOT 0.6 10/22/2022 2311   GFRNONAA >60 10/22/2022 2311   GFRAA >90 05/21/2012 0605   Lab Results  Component Value Date   CHOL 258 (H) 04/24/2022   HDL 110.60 04/24/2022   LDLCALC 137 (H) 04/24/2022   LDLDIRECT 106.8 04/12/2012   TRIG 54.0 04/24/2022   No results found for: "HGBA1C" Lab Results  Component Value Date   VITAMINB12 435 04/24/2022   Lab Results  Component Value Date   TSH 1.45 04/24/2022    MRI Brain 10/23/2022 No acute intracranial process. No evidence of acute or subacute infarct.  I personally reviewed brain Images.  ASSESSMENT AND PLAN  72 y.o. year old female  with history of GERD, anemia, depression, alcohol use who is presenting after seizure-like activity, her episode was likely related to alcohol as her level was 50.  Her MRI was negative for any acute abnormality, we will proceed with getting the EEG.  I will contact the patient to go over the EEG results.  We discussed driving restriction for the next 6 months.  She voices understanding.  Advised patient to continue following up with his PCP and to return as needed.   1. Seizures ([place])     Patient Instructions  Routine EEG, I will contact you to go over the result Continue other medication Discussed driving restriction for next 6 months Follow-up with your PCP Return as needed   Per [facility] statutes, patients with seizures are not allowed to drive until they have been seizure-free for six months.  Other recommendations include using caution when using heavy equipment or power tools. Avoid working on ladders or at heights. Take showers instead of baths.  Do not swim alone.  Ensure the water temperature is not too high on the home water heater. Do not go swimming alone. Do not lock yourself  in a room alone  (i.e. bathroom). When caring for infants or small children, sit down when holding, feeding, or changing them to minimize risk of injury to the child in the event you have a seizure. Maintain good sleep hygiene. Avoid alcohol.  Also recommend adequate sleep, hydration, good diet and minimize stress.   During the Seizure  - First, ensure adequate ventilation and place patients on the floor on their left side  Loosen clothing around the neck and ensure the airway is patent. If the patient is clenching the teeth, do not force the mouth open with any object as this can cause severe damage - Remove all items from the surrounding that can be hazardous. The patient may be oblivious to what's happening and may not even know what he or she is doing. If the patient is confused and wandering, either gently guide him/her away and block access to outside areas - Reassure the individual and be comforting - Call 911. In most cases, the seizure ends before EMS arrives. However, there are cases when seizures may last over 3 to 5 minutes. Or the individual may have developed breathing difficulties or severe injuries. If a pregnant patient or a person with diabetes develops a seizure, it is prudent to call an ambulance. - Finally, if the patient does not regain full consciousness, then call EMS. Most patients will remain confused for about 45 to 90 minutes after a seizure, so you must use judgment in calling for help. - Avoid restraints but make sure the patient is in a bed with padded side rails - Place the individual in a lateral position with the neck slightly flexed; this will help the saliva drain from the mouth and prevent the tongue from falling backward - Remove all nearby furniture and other hazards from the area - Provide verbal assurance as the individual is regaining consciousness - Provide the patient with privacy if possible - Call for help and start treatment as ordered by the caregiver   After the  Seizure (Postictal Stage)  After a seizure, most patients experience confusion, fatigue, muscle pain and/or a headache. Thus, one should permit the individual to sleep. For the next few days, reassurance is essential. Being calm and helping reorient the person is also of importance.  Most seizures are painless and end spontaneously. Seizures are not harmful to others but can lead to complications such as stress on the lungs, brain and the heart. Individuals with prior lung problems may develop labored breathing and respiratory distress.     Orders Placed This Encounter  Procedures   EEG adult    No orders of the defined types were placed in this encounter.   Return if symptoms worsen or fail to improve.    Alric Ran, MD 10/26/2022, 9:18 AM  [facility] [address], [place], [place] [address] [phone]

## 2022-11-02 ENCOUNTER — Ambulatory Visit: Payer: Medicare Other

## 2022-11-02 ENCOUNTER — Telehealth: Payer: Self-pay

## 2022-11-02 ENCOUNTER — Ambulatory Visit: Payer: 59 | Admitting: Diagnostic Neuroimaging

## 2022-11-02 NOTE — Progress Notes (Unsigned)
Attempted to do pre-visit with pt but she informed RN that last Sunday she had and event where she was unresponsive at home and was sent to hospital by EMS. She desires to cancel the procedure at this time until she has a clear picture of her POC r/t her event. RN to cancel procedure per pt request. Pt states she will call and set up and OV with Dr Havery Moros once she has a better idea of her health situation.

## 2022-11-02 NOTE — Telephone Encounter (Signed)
        Patient  visited [facility] [place] on 10/23/2022  for seizure.   Telephone encounter attempt :  2nd  A HIPAA compliant voice message was left requesting a return call.  Instructed patient to call back at [phone].   [place] Resource Care Guide   [email or web address]  ?? [phone]   Website: [email or web address]  [email or web address]

## 2022-11-06 ENCOUNTER — Telehealth: Payer: Self-pay

## 2022-11-06 NOTE — Telephone Encounter (Signed)
        Patient  visited [facility] [place] on 10/23/2022  for seizure.   Telephone encounter attempt :  3rd  A HIPAA compliant voice message was left requesting a return call.  Instructed patient to call back at [phone].   [place] Resource Care Guide   [email or web address]  ?? [phone]   Website: [email or web address]

## 2022-11-13 ENCOUNTER — Ambulatory Visit (INDEPENDENT_AMBULATORY_CARE_PROVIDER_SITE_OTHER): Payer: Medicare Other | Admitting: Neurology

## 2022-11-13 DIAGNOSIS — R569 Unspecified convulsions: Secondary | ICD-10-CM

## 2022-11-13 NOTE — Procedures (Signed)
    History:  72 year old woman with seizure, EEG for background assessment   EEG classification: Awake and drowsy  Description of the recording: The background rhythms of this recording consists of a fairly well modulated medium amplitude alpha rhythm of 12 Hz that is reactive to eye opening and closure. Present in the anterior head region is a 15-20 Hz beta activity. Photic stimulation was performed, did not show any abnormalities. Hyperventilation was also performed, did not show any abnormalities. Drowsiness was manifested by background fragmentation. No abnormal epileptiform discharges seen during this recording. There was no focal slowing. There were no electrographic seizure identified.   Abnormality: None   Impression: This is a normal EEG recorded while drowsy and awake. No evidence of interictal epileptiform discharges. Normal EEGs, however, do not rule out epilepsy.    Alric Ran, MD [facility]

## 2022-11-14 ENCOUNTER — Ambulatory Visit: Payer: Self-pay | Admitting: Licensed Clinical Social Worker

## 2022-11-14 ENCOUNTER — Encounter: Payer: Medicare Other | Admitting: Gastroenterology

## 2022-11-14 NOTE — Patient Outreach (Signed)
  Care Coordination  Follow Up Visit Note   11/14/2022 Name: Natalie Fletcher MRN: [id number] DOB: 1951/08/01  Natalie Fletcher is a 72 y.o. year old female who sees Plotnikov, Evie Lacks, MD for primary care. I spoke with  [name] by phone today.  What matters to the patients health and wellness today?  Reports doing well with managing stress.  Will contact LCSW if needed.   Goals Addressed             This Visit's Progress    COMPLETED: Care Coordination Activities       Care Coordination Interventions: Solution-Focused Strategies employed:  Completed last EMMI education: Reviewed with patient  Assessed sleep: Reports no concerns at this time Caregiver stress acknowledged :         SDOH assessments and interventions completed:  No    Care Coordination Interventions:  No, not indicated   Follow up plan: No further intervention required.   Encounter Outcome:  Pt. Visit Completed   Casimer Lanius, [place] [phone]

## 2022-11-14 NOTE — Patient Instructions (Signed)
Visit Information  Thank you for taking time to visit with me today. Please don't hesitate to contact me if I can be of assistance to you.   Following are the goals we discussed today:   Goals Addressed             This Visit's Progress    COMPLETED: Care Coordination Activities       Care Coordination Interventions: Solution-Focused Strategies employed:  Completed last EMMI education: Reviewed with patient  Assessed sleep: Reports no concerns at this time Caregiver stress acknowledged :        Please call the care guide team at [phone] if you need to cancel or reschedule your appointment.   If you are experiencing a Mental Health or [place] or need someone to talk to, please call 1-800-273-TALK (toll free, 24 hour hotline) go to [facility] [address], [place] [phone])   Patient verbalizes understanding of instructions and care plan provided today and agrees to view in [place]. Active MyChart status and patient understanding of how to access instructions and care plan via MyChart confirmed with patient.     No further follow up required: By Care Coordination you will call if needs arise  Casimer Lanius, [place] [phone]

## 2023-04-22 ENCOUNTER — Other Ambulatory Visit: Payer: Self-pay | Admitting: Internal Medicine

## 2023-04-22 DIAGNOSIS — F32A Depression, unspecified: Secondary | ICD-10-CM

## 2023-04-22 DIAGNOSIS — R413 Other amnesia: Secondary | ICD-10-CM

## 2023-04-22 DIAGNOSIS — R5383 Other fatigue: Secondary | ICD-10-CM

## 2023-07-19 ENCOUNTER — Other Ambulatory Visit: Payer: Self-pay | Admitting: Internal Medicine

## 2023-07-19 DIAGNOSIS — R413 Other amnesia: Secondary | ICD-10-CM

## 2023-07-19 DIAGNOSIS — F32A Depression, unspecified: Secondary | ICD-10-CM

## 2023-07-19 DIAGNOSIS — R5383 Other fatigue: Secondary | ICD-10-CM

## 2023-08-27 ENCOUNTER — Ambulatory Visit (INDEPENDENT_AMBULATORY_CARE_PROVIDER_SITE_OTHER): Payer: Medicare Other

## 2023-08-27 VITALS — BP 140/98 | HR 77 | Temp 99.4°F | Ht 62.0 in | Wt 152.0 lb

## 2023-08-27 DIAGNOSIS — Z0000 Encounter for general adult medical examination without abnormal findings: Secondary | ICD-10-CM | POA: Diagnosis not present

## 2023-08-27 NOTE — Patient Instructions (Signed)
It was great speaking with you today!  Please schedule your next Medicare Wellness Visit with your Nurse Health Advisor in 1 year by calling [phone]. 

## 2023-08-27 NOTE — Progress Notes (Cosign Needed Addendum)
Subjective:   Natalie Fletcher is a 72 y.o. female who presents for Medicare Annual (Subsequent) preventive examination.  Visit Complete: Virtual I connected with  Shelva Majestic on 08/27/23 by a audio enabled telemedicine application and verified that I am speaking with the correct person using two identifiers.  Patient Location: Home  Provider Location: Office/Clinic  I discussed the limitations of evaluation and management by telemedicine. The patient expressed understanding and agreed to proceed.  Vital Signs: Because this visit was a virtual/telehealth visit, some criteria may be missing or patient reported. Any vitals not documented were not able to be obtained and vitals that have been documented are patient reported.  Patient Medicare AWV questionnaire was completed by the patient on N/A; I have confirmed that all information answered by patient is correct and no changes since this date.  Cardiac Risk Factors include: advanced age (>44men, >85 women)     Objective:    Today's Vitals   08/27/23 0937 08/27/23 0944  BP: (!) 140/98   Pulse: 77   Temp: 99.4 F (37.4 C)   SpO2: 90%   Weight: 152 lb (68.9 kg)   Height: 5\' 2"  (1.575 m)   PainSc:  4    Body mass index is 27.8 kg/m. Patient was unable to self-report due to a lack of equipment at home via telehealth, using last office visit vitals.      08/27/2023    9:47 AM 10/22/2022   11:31 PM 07/28/2022    8:31 AM 05/20/2012    6:58 AM 05/13/2012    8:30 AM  Advanced Directives  Does Patient Have a Medical Advance Directive? No Yes Yes  Patient does not have advance directive;Patient would like information  Type of Customer service manager Power of [employer] Power of [place];Living will    Copy of Healthcare Power of Attorney in Chart?   No - copy requested    Would patient like information on creating a medical advance directive? No - Patient declined      Pre-existing out of facility DNR order (yellow  form or pink MOST form)    No     Current Medications (verified) Outpatient Encounter Medications as of 08/27/2023  Medication Sig   FLUoxetine (PROZAC) 10 MG capsule TAKE 1 CAPSULE BY MOUTH EVERY DAY   OVER THE COUNTER MEDICATION Take 1 capsule by mouth daily. [employer]ola Mushroom for memory and focus   potassium chloride SA (KLOR-CON M) 20 MEQ tablet Take 1 tablet (20 mEq total) by mouth 2 (two) times daily.   No facility-administered encounter medications on file as of 08/27/2023.    Allergies (verified) Codeine   History: Past Medical History:  Diagnosis Date   Anemia    Arthritis    Depression    GERD (gastroesophageal reflux disease)    Seizures (HCC)    Stroke [facility])    Past Surgical History:  Procedure Laterality Date   COLONOSCOPY  12/2007   PARTIAL KNEE ARTHROPLASTY  05/20/2012   Procedure: UNICOMPARTMENTAL KNEE;  Surgeon: Raymon Mutton, MD;  Location: [facility] OR;  Service: Orthopedics;  Laterality: Right;  Uni Medial Compartmental Right Knee    Family History  Problem Relation Age of Onset   Kidney cancer Mother 9       kidney ca   COPD Father    Kidney cancer Maternal Grandmother 20       kidney ca   Breast cancer Maternal Aunt    Colon cancer Neg Hx  Rectal cancer Neg Hx    Stomach cancer Neg Hx    Social History   Socioeconomic History   Marital status: Married    Spouse name: Not on file   Number of children: 2   Years of education: Not on file   Highest education level: Not on file  Occupational History    Employer: [employer]  Tobacco Use   Smoking status: Never   Smokeless tobacco: Never  Vaping Use   Vaping status: Never Used  Substance and Sexual Activity   Alcohol use: Yes    Alcohol/week: 1.0 standard drink of alcohol    Types: 1 Glasses of wine per week    Comment: occasionally   Drug use: No   Sexual activity: Yes    Birth control/protection: Post-menopausal  Other Topics Concern   Not on file  Social History Narrative   Not on  file   Social Determinants of Health   Financial Resource Strain: Low Risk  (08/27/2023)   Overall Financial Resource Strain (CARDIA)    Difficulty of Paying Living Expenses: Not hard at all  Food Insecurity: No Food Insecurity (08/27/2023)   Hunger Vital Sign    Worried About Running Out of Food in the Last Year: Never true    Ran Out of Food in the Last Year: Never true  Transportation Needs: No Transportation Needs (08/27/2023)   PRAPARE - Administrator, Civil Service (Medical): No    Lack of Transportation (Non-Medical): No  Physical Activity: Insufficiently Active (08/27/2023)   Exercise Vital Sign    Days of Exercise per Week: 2 days    Minutes of Exercise per Session: 20 min  Stress: No Stress Concern Present (08/27/2023)   [employer] of Occupational Health - Occupational Stress Questionnaire    Feeling of Stress : Only a little  Social Connections: Socially Integrated (08/27/2023)   Social Connection and Isolation Panel [NHANES]    Frequency of Communication with Friends and Family: More than three times a week    Frequency of Social Gatherings with Friends and Family: More than three times a week    Attends Religious Services: More than 4 times per year    Active Member of [employer] or Organizations: Yes    Attends Engineer, structural: More than 4 times per year    Marital Status: Married    Tobacco Counseling Counseling given: Not Answered   Clinical Intake:  Pre-visit preparation completed: Yes  Pain : 0-10 Pain Score: 4  Pain Type: Chronic pain Pain Location: Knee Pain Orientation: Left, Right Pain Descriptors / Indicators: Aching Pain Onset: More than a month ago Pain Frequency: Intermittent     BMI - recorded: 27 Nutritional Status: BMI 25 -29 Overweight Nutritional Risks: None Diabetes: No  How often do you need to have someone help you when you read instructions, pamphlets, or other written materials from your doctor or  pharmacy?: 1 - Never What is the last grade level you completed in school?: High School degree, extra schooling after  Interpreter Needed?: No  Information entered by :: Elyse Jarvis, CMA   Activities of Daily Living    08/27/2023    9:48 AM  In your present state of health, do you have any difficulty performing the following activities:  Hearing? 0  Vision? 0  Difficulty concentrating or making decisions? 0  Walking or climbing stairs? 0  Dressing or bathing? 0  Doing errands, shopping? 0  Preparing Food and eating ? N  Using the Toilet? N  In the past six months, have you accidently leaked urine? N  Do you have problems with loss of bowel control? N  Managing your Medications? N  Managing your Finances? N  Housekeeping or managing your Housekeeping? N    Patient Care Team: Plotnikov, Georgina Quint, MD as PCP - Al Corpus, MD as Consulting Physician (Orthopedic Surgery) Louis Meckel, MD (Inactive) as Consulting Physician (Gastroenterology) Tracey Harries, MD as Consulting Physician (Obstetrics and Gynecology)  Indicate any recent Medical Services you may have received from other than [facility] providers in the past year (date may be approximate).     Assessment:   This is a routine wellness examination for Natalie Fletcher.  Hearing/Vision screen Patient denied any hearing difficulty. No hearing aids. Patient does wear corrective lenses/contacts.    Goals Addressed             This Visit's Progress    Patient Stated       I would like to get back to going for walks.        Depression Screen    08/27/2023    9:46 AM 07/28/2022    8:30 AM 07/24/2022    9:05 AM 04/24/2022    3:00 PM 09/12/2017    8:34 AM  PHQ 2/9 Scores  PHQ - 2 Score 1 0 2 1 0  PHQ- 9 Score  0 7      Fall Risk    08/27/2023    9:48 AM 07/28/2022    8:27 AM 04/24/2022    3:00 PM 09/12/2017    8:34 AM  Fall Risk   Falls in the past year? 0 0 1 No  Number falls in past yr: 0 0 0    Injury with Fall? 0 0 1   Risk for fall due to : No Fall Risks No Fall Risks No Fall Risks   Follow up Falls evaluation completed Falls prevention discussed Falls evaluation completed     MEDICARE RISK AT HOME: Medicare Risk at Home Any stairs in or around the home?: No If so, are there any without handrails?: No Home free of loose throw rugs in walkways, pet beds, electrical cords, etc?: Yes Adequate lighting in your home to reduce risk of falls?: Yes Life alert?: No Use of a cane, walker or w/c?: No Grab bars in the bathroom?: No Shower chair or bench in shower?: Yes Elevated toilet seat or a handicapped toilet?: No  TIMED UP AND GO:  Was the test performed?  No    Cognitive Function:  Patient is cogitatively intact.      08/27/2023    9:48 AM  6CIT Screen  What Year? 0 points  What month? 0 points  What time? 0 points  Count back from 20 0 points  Months in reverse 0 points  Repeat phrase 0 points  Total Score 0 points    Immunizations Immunization History  Administered Date(s) Administered   Fluad Quad(high Dose 65+) 07/02/2019, 07/13/2021, 07/14/2022   Influenza Split 07/17/2011, 07/16/2012   Influenza Whole 07/27/2008   Influenza, High Dose Seasonal PF 06/27/2016, 07/08/2018, 07/26/2020   Influenza,inj,Quad PF,6+ Mos 06/23/2014, 07/10/2015   Influenza-Unspecified 07/05/2017, 08/17/2023   PFIZER Comirnaty(Gray Top)Covid-19 Tri-Sucrose Vaccine 02/01/2021   PFIZER(Purple Top)SARS-COV-2 Vaccination 11/21/2019, 12/16/2019, 09/29/2020   PPD Test 12/14/2015   Pfizer(Comirnaty)Fall Seasonal Vaccine 12 years and older 07/14/2022   Tdap 05/31/2022   Unspecified SARS-COV-2 Vaccination 08/17/2023   Zoster Recombinant(Shingrix) 11/26/2021   Zoster, Live 07/17/2011  TDAP status: Up to date  Flu Vaccine status: Up to date  Pneumococcal vaccine status: Due, Education has been provided regarding the importance of this vaccine. Advised may receive this vaccine at  local pharmacy or Health Dept. Aware to provide a copy of the vaccination record if obtained from local pharmacy or Health Dept. Verbalized acceptance and understanding.  Covid-19 vaccine status: Completed vaccines  Qualifies for Shingles Vaccine? Yes   Zostavax completed No   Shingrix Completed?: No.    Education has been provided regarding the importance of this vaccine. Patient has been advised to call insurance company to determine out of pocket expense if they have not yet received this vaccine. Advised may also receive vaccine at local pharmacy or Health Dept. Verbalized acceptance and understanding.  Screening Tests Health Maintenance  Topic Date Due   Pneumonia Vaccine 26+ Years old (1 of 1 - PCV) Never done   Zoster Vaccines- Shingrix (2 of 2) 01/21/2022   Colonoscopy  07/28/2022   Medicare Annual Wellness (AWV)  08/26/2024   MAMMOGRAM  09/25/2024   DTaP/Tdap/Td (2 - Td or Tdap) 05/31/2032   INFLUENZA VACCINE  Completed   DEXA SCAN  Completed   COVID-19 Vaccine  Completed   Hepatitis C Screening  Completed   HPV VACCINES  Aged Out    Health Maintenance  Health Maintenance Due  Topic Date Due   Pneumonia Vaccine 35+ Years old (1 of 1 - PCV) Never done   Zoster Vaccines- Shingrix (2 of 2) 01/21/2022   Colonoscopy  07/28/2022    Colorectal Cancer screening: Patient declined at this time  Mammogram status: Completed 09/25/2022. Repeat every year  Bone Density status: Completed 08/08/2021. Results reflect: Bone density results: NORMAL. Repeat every N/A years.  Lung Cancer Screening: (Low Dose CT Chest recommended if Age 31-80 years, 20 pack-year currently smoking OR have quit w/in 15years.) does not qualify.   Lung Cancer Screening Referral: NA  Additional Screening:  Hepatitis C Screening: does qualify; Completed 01/06/2016  Vision Screening: Recommended annual ophthalmology exams for early detection of glaucoma and other disorders of the eye. Is the patient up to  date with their annual eye exam?  Yes  Who is the provider or what is the name of the office in which the patient attends annual eye exams? [facility] If pt is not established with a provider, would they like to be referred to a provider to establish care? No .   Dental Screening: Recommended annual dental exams for proper oral hygiene   Community Resource Referral / Chronic Care Management: CRR required this visit?  No   CCM required this visit?  No     Plan:     I have personally reviewed and noted the following in the patient's chart:   Medical and social history Use of alcohol, tobacco or illicit drugs  Current medications and supplements including opioid prescriptions. Patient is not currently taking opioid prescriptions. Functional ability and status Nutritional status Physical activity Advanced directives List of other physicians Hospitalizations, surgeries, and ER visits in previous 12 months Vitals Screenings to include cognitive, depression, and falls Referrals and appointments  In addition, I have reviewed and discussed with patient certain preventive protocols, quality metrics, and best practice recommendations. A written personalized care plan for preventive services as well as general preventive health recommendations were provided to patient.     Marinus Maw, CMA   08/27/2023   After Visit Summary: (MyChart) Due to this being a telephonic visit, the after  visit summary with patients personalized plan was offered to patient via MyChart   Nurse Notes: N/A  Medical screening examination/treatment/procedure(s) were performed by non-physician practitioner and as supervising physician I was immediately available for consultation/collaboration.  I agree with above. Jacinta Shoe, MD

## 2023-08-29 ENCOUNTER — Ambulatory Visit: Payer: Medicare Other | Admitting: Internal Medicine

## 2023-08-29 ENCOUNTER — Encounter: Payer: Self-pay | Admitting: Internal Medicine

## 2023-08-29 VITALS — BP 124/82 | HR 75 | Temp 98.2°F | Ht 62.0 in | Wt 157.0 lb

## 2023-08-29 DIAGNOSIS — F32A Depression, unspecified: Secondary | ICD-10-CM | POA: Diagnosis not present

## 2023-08-29 DIAGNOSIS — R5383 Other fatigue: Secondary | ICD-10-CM

## 2023-08-29 DIAGNOSIS — R413 Other amnesia: Secondary | ICD-10-CM | POA: Diagnosis not present

## 2023-08-29 MED ORDER — FLUOXETINE HCL 10 MG PO CAPS: 10.0000 mg | ORAL_CAPSULE | Freq: Every day | ORAL | 1 refills | Status: DC

## 2023-08-29 MED ORDER — B COMPLEX-C PO TABS: 1.0000 | ORAL_TABLET | Freq: Every day | ORAL | 3 refills | Status: AC

## 2023-08-29 NOTE — Assessment & Plan Note (Addendum)
Start Prozac  Hopefully, memory will improve with depression treatment Cont Lion's mane B complex

## 2023-08-29 NOTE — Progress Notes (Addendum)
Subjective:  Patient ID: Natalie Fletcher, female    DOB: 09/08/1951  Age: 72 y.o. MRN: [id number]  CC: Medication Refill   HPI Natalie Fletcher presents for memory problems, anxiety, depression  Outpatient Medications Prior to Visit  Medication Sig Dispense Refill   OVER THE COUNTER MEDICATION Take 1 capsule by mouth daily. [employer] Mushroom for memory and focus     potassium chloride SA (KLOR-CON M) 20 MEQ tablet Take 1 tablet (20 mEq total) by mouth 2 (two) times daily. 10 tablet 0   FLUoxetine (PROZAC) 10 MG capsule TAKE 1 CAPSULE BY MOUTH EVERY DAY 90 capsule 0   No facility-administered medications prior to visit.    ROS: Review of Systems  Constitutional:  Positive for fatigue. Negative for activity change, appetite change, chills and unexpected weight change.  HENT:  Negative for congestion, mouth sores and sinus pressure.   Eyes:  Negative for visual disturbance.  Respiratory:  Negative for cough and chest tightness.   Gastrointestinal:  Negative for abdominal pain and nausea.  Genitourinary:  Negative for difficulty urinating, frequency and vaginal pain.  Musculoskeletal:  Negative for back pain and gait problem.  Skin:  Negative for pallor and rash.  Neurological:  Negative for dizziness, tremors, weakness, numbness and headaches.  Psychiatric/Behavioral:  Negative for confusion, sleep disturbance and suicidal ideas. The patient is nervous/anxious.     Objective:  BP 124/82 (BP Location: Left Arm, Patient Position: Sitting, Cuff Size: Normal)   Pulse 75   Temp 98.2 F (36.8 C) (Oral)   Ht 5\' 2"  (1.575 m)   Wt 157 lb (71.2 kg)   SpO2 97%   BMI 28.72 kg/m   BP Readings from Last 3 Encounters:  08/29/23 124/82  08/27/23 (!) 140/98  10/26/22 (!) 170/81    Wt Readings from Last 3 Encounters:  08/29/23 157 lb (71.2 kg)  08/27/23 152 lb (68.9 kg)  10/26/22 153 lb (69.4 kg)    Physical Exam Constitutional:      General: She is not in acute distress.     Appearance: Normal appearance. She is well-developed.  HENT:     Head: Normocephalic.     Right Ear: External ear normal.     Left Ear: External ear normal.     Nose: Nose normal.  Eyes:     General:        Right eye: No discharge.        Left eye: No discharge.     Conjunctiva/sclera: Conjunctivae normal.     Pupils: Pupils are equal, round, and reactive to light.  Neck:     Thyroid: No thyromegaly.     Vascular: No JVD.     Trachea: No tracheal deviation.  Cardiovascular:     Rate and Rhythm: Normal rate and regular rhythm.     Heart sounds: Normal heart sounds.  Pulmonary:     Effort: No respiratory distress.     Breath sounds: No stridor. No wheezing.  Abdominal:     General: Bowel sounds are normal. There is no distension.     Palpations: Abdomen is soft. There is no mass.     Tenderness: There is no abdominal tenderness. There is no guarding or rebound.  Musculoskeletal:        General: No tenderness.     Cervical back: Normal range of motion and neck supple. No rigidity.  Lymphadenopathy:     Cervical: No cervical adenopathy.  Skin:    Findings: No erythema or  rash.  Neurological:     Mental Status: She is oriented to person, place, and time.     Cranial Nerves: No cranial nerve deficit.     Motor: No abnormal muscle tone.     Coordination: Coordination normal.     Deep Tendon Reflexes: Reflexes normal.  Psychiatric:        Behavior: Behavior normal.        Thought Content: Thought content normal.        Judgment: Judgment normal.   Memory is grossly intact  Lab Results  Component Value Date   WBC 5.3 10/22/2022   HGB 12.6 10/22/2022   HCT 37.0 10/22/2022   PLT 254 10/22/2022   GLUCOSE 108 (H) 10/22/2022   CHOL 258 (H) 04/24/2022   TRIG 54.0 04/24/2022   HDL 110.60 04/24/2022   LDLDIRECT 106.8 04/12/2012   LDLCALC 137 (H) 04/24/2022   ALT 13 10/22/2022   AST 19 10/22/2022   NA 136 10/22/2022   K 3.4 (L) 10/22/2022   CL 103 10/22/2022   CREATININE  1.00 10/22/2022   BUN 14 10/22/2022   CO2 22 10/22/2022   TSH 1.45 04/24/2022   INR 1.0 10/22/2022    MR BRAIN WO CONTRAST  Result Date: 10/23/2022 CLINICAL DATA:  TIA, stroke suspected EXAM: MRI HEAD WITHOUT CONTRAST TECHNIQUE: Multiplanar, multiecho pulse sequences of the brain and surrounding structures were obtained without intravenous contrast. COMPARISON:  No prior MRI available, correlation is made with CT head 10/22/2022 FINDINGS: Brain: No restricted diffusion to suggest acute or subacute infarct.No acute hemorrhage, mass, mass effect, or midline shift. No hydrocephalus or extra-axial collection.No hemosiderin deposition to suggest remote hemorrhage.Normal pituitary and craniocervical junction.Scattered T2 hyperintense signal in the periventricular white matter, likely the sequela of mild-to-moderate chronic small vessel ischemic disease. Vascular: Patent arterial flow voids. Skull and upper cervical spine: Normal marrow signal. Sinuses/Orbits: Clear paranasal sinuses.No acute finding in the orbits. Other: The mastoid air cells are well aerated. IMPRESSION: No acute intracranial process. No evidence of acute or subacute infarct. Electronically Signed   By: Wiliam Ke M.D.   On: 10/23/2022 02:14   CT HEAD CODE STROKE WO CONTRAST  Result Date: 10/22/2022 CLINICAL DATA:  Code stroke. EXAM: CT HEAD WITHOUT CONTRAST TECHNIQUE: Contiguous axial images were obtained from the base of the skull through the vertex without intravenous contrast. RADIATION DOSE REDUCTION: This exam was performed according to the departmental dose-optimization program which includes automated exposure control, adjustment of the mA and/or kV according to patient size and/or use of iterative reconstruction technique. COMPARISON:  None Available. FINDINGS: Brain: No evidence of acute infarction, hemorrhage, mass, mass effect, or midline shift. No hydrocephalus or extra-axial collection. Normal cerebral volume for age peer  Vascular: No hyperdense vessel. Skull: Negative for fracture or focal lesion. Sinuses/Orbits: No acute finding. Other: The mastoid air cells are well aerated. ASPECTS [facility] Stroke Program Early CT Score) - Ganglionic level infarction (caudate, lentiform nuclei, internal capsule, insula, M1-M3 cortex): 7 - Supraganglionic infarction (M4-M6 cortex): 3 Total score (0-10 with 10 being normal): 10 IMPRESSION: 1. No acute intracranial process. 2. ASPECTS is 10 Code stroke imaging results were discussed on 10/22/2022 at 11:28 pm with provider Dr. Derry Lory via telephone. Electronically Signed   By: Wiliam Ke M.D.   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 Neurology consultation was offered with in-depth memory testing. 3 depression       Relevant Medications   FLUoxetine (PROZAC) 10 MG capsule   Fatigue    Multifactorial.  Hopefully, fatigue will improve with depression/insomnia treatment      Relevant Medications   FLUoxetine (PROZAC) 10 MG capsule      Meds ordered this encounter  Medications   FLUoxetine (PROZAC) 10 MG capsule    Sig: Take 1 capsule (10 mg total) by mouth daily.    Dispense:  90 capsule    Refill:  1   B Complex-C (B-COMPLEX WITH VITAMIN C) tablet    Sig: Take 1 tablet by mouth daily.    Dispense:  100 tablet    Refill:  3      Follow-up: Return in about 3 months (around 11/29/2023) for a follow-up visit.  Sonda Primes, MD

## 2023-09-04 ENCOUNTER — Other Ambulatory Visit: Payer: Self-pay | Admitting: Obstetrics and Gynecology

## 2023-09-04 ENCOUNTER — Encounter: Payer: Self-pay | Admitting: Internal Medicine

## 2023-09-04 DIAGNOSIS — Z1231 Encounter for screening mammogram for malignant neoplasm of breast: Secondary | ICD-10-CM

## 2023-09-16 NOTE — Assessment & Plan Note (Signed)
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 Neurology consultation was offered with in-depth memory testing. 3 depression

## 2023-09-16 NOTE — Assessment & Plan Note (Signed)
Multifactorial.  Hopefully, fatigue will improve with depression/insomnia treatment

## 2023-10-05 ENCOUNTER — Ambulatory Visit: Payer: Medicare Other

## 2023-12-12 ENCOUNTER — Encounter (HOSPITAL_COMMUNITY): Payer: Self-pay | Admitting: Emergency Medicine

## 2023-12-12 ENCOUNTER — Emergency Department (HOSPITAL_COMMUNITY)
Admission: EM | Admit: 2023-12-12 | Discharge: 2023-12-13 | Disposition: A | Discharge status: Home / Self Care | Payer: Medicare Other | Attending: Emergency Medicine | Admitting: Emergency Medicine

## 2023-12-12 ENCOUNTER — Other Ambulatory Visit: Payer: Self-pay

## 2023-12-12 DIAGNOSIS — T426X1A Poisoning by other antiepileptic and sedative-hypnotic drugs, accidental (unintentional), initial encounter: Secondary | ICD-10-CM | POA: Diagnosis not present

## 2023-12-12 DIAGNOSIS — X58XXXA Exposure to other specified factors, initial encounter: Secondary | ICD-10-CM | POA: Diagnosis not present

## 2023-12-12 DIAGNOSIS — R799 Abnormal finding of blood chemistry, unspecified: Secondary | ICD-10-CM | POA: Insufficient documentation

## 2023-12-12 DIAGNOSIS — T50901A Poisoning by unspecified drugs, medicaments and biological substances, accidental (unintentional), initial encounter: Secondary | ICD-10-CM

## 2023-12-12 LAB — CBG MONITORING, ED: Glucose-Capillary: 94 mg/dL (ref 70–99)

## 2023-12-12 LAB — COMPREHENSIVE METABOLIC PANEL
ALT: 16 U/L (ref 0–44)
AST: 26 U/L (ref 15–41)
Albumin: 3.8 g/dL (ref 3.5–5.0)
Alkaline Phosphatase: 55 U/L (ref 38–126)
Anion gap: 10 (ref 5–15)
BUN: 20 mg/dL (ref 8–23)
CO2: 24 mmol/L (ref 22–32)
Calcium: 8.9 mg/dL (ref 8.9–10.3)
Chloride: 104 mmol/L (ref 98–111)
Creatinine, Ser: 0.69 mg/dL (ref 0.44–1.00)
GFR, Estimated: >60 mL/min (ref >60)
Glucose, Bld: 99 mg/dL (ref 70–99)
Potassium: 3.5 mmol/L (ref 3.5–5.1)
Sodium: 138 mmol/L (ref 135–145)
Total Bilirubin: 0.4 mg/dL (ref 0.0–1.2)
Total Protein: 6.2 g/dL — ABNORMAL LOW (ref 6.5–8.1)

## 2023-12-12 LAB — CBC WITH DIFFERENTIAL/PLATELET
Abs Immature Granulocytes: 0.01 10*3/uL (ref 0.00–0.07)
Basophils Absolute: 0 10*3/uL (ref 0.0–0.1)
Basophils Relative: 1 %
Eosinophils Absolute: 0.2 10*3/uL (ref 0.0–0.5)
Eosinophils Relative: 4 %
HCT: 35.3 % — ABNORMAL LOW (ref 36.0–46.0)
Hemoglobin: 11.2 g/dL — ABNORMAL LOW (ref 12.0–15.0)
Immature Granulocytes: 0 %
Lymphocytes Relative: 36 %
Lymphs Abs: 1.7 10*3/uL (ref 0.7–4.0)
MCH: 31.5 pg (ref 26.0–34.0)
MCHC: 31.7 g/dL (ref 30.0–36.0)
MCV: 99.2 fL (ref 80.0–100.0)
Monocytes Absolute: 0.5 10*3/uL (ref 0.1–1.0)
Monocytes Relative: 10 %
Neutro Abs: 2.3 10*3/uL (ref 1.7–7.7)
Neutrophils Relative %: 49 %
Platelets: 235 10*3/uL (ref 150–400)
RBC: 3.56 MIL/uL — ABNORMAL LOW (ref 3.87–5.11)
RDW: 13.6 % (ref 11.5–15.5)
WBC: 4.8 10*3/uL (ref 4.0–10.5)
nRBC: 0 % (ref 0.0–0.2)

## 2023-12-12 LAB — ETHANOL: Alcohol, Ethyl (B): <10 mg/dL (ref <10)

## 2023-12-12 LAB — SALICYLATE LEVEL: Salicylate Lvl: <7 mg/dL — ABNORMAL LOW (ref 7.0–30.0)

## 2023-12-12 LAB — ACETAMINOPHEN LEVEL: Acetaminophen (Tylenol), Serum: <10 ug/mL — ABNORMAL LOW (ref 10–30)

## 2023-12-12 MED ORDER — NALOXONE HCL 0.4 MG/ML IJ SOLN: 0.4000 mg | Freq: Once | INTRAMUSCULAR | Status: DC

## 2023-12-12 NOTE — ED Notes (Signed)
 Per poison control:  Monitor for 6 hours for confusion, resp depression, hallucinations. Narcan as needed for resp depression. D/c once A&Ox4 and ambulatory. Sleepiness is normal

## 2023-12-12 NOTE — ED Triage Notes (Signed)
 BIBA Per EMS: Pt coming from home w/ c/o taking 2 10mg  Ambien that were prescribed to her husband. Pt reports she just wanted to go to sleep & she was sleepy. Poison control reports to watch out for confusion and hallucination.

## 2023-12-13 DIAGNOSIS — T426X1A Poisoning by other antiepileptic and sedative-hypnotic drugs, accidental (unintentional), initial encounter: Secondary | ICD-10-CM | POA: Diagnosis not present

## 2023-12-13 LAB — RAPID URINE DRUG SCREEN, HOSP PERFORMED
Amphetamines: NOT DETECTED
Barbiturates: NOT DETECTED
Benzodiazepines: NOT DETECTED
Cocaine: NOT DETECTED
Opiates: NOT DETECTED
Tetrahydrocannabinol: NOT DETECTED

## 2023-12-13 NOTE — ED Notes (Signed)
 Pt given ginger ale per MD order and ambulated to bathroom independently. Pt tolerated both well. MD made aware that pt passed PO and ambulation challenge. Pt cleared by poison control @ 0500.

## 2023-12-13 NOTE — ED Provider Notes (Signed)
 [place] EMERGENCY DEPARTMENT AT [facility] Provider Note   CSN: [id number] Arrival date & time: 12/12/23  2303     History  Chief Complaint  Patient presents with   Drug Overdose    Natalie Fletcher is a 72 y.o. female.  The history is provided by the patient and the EMS personnel.  Drug Overdose This is a new problem. The current episode started less than 1 hour ago. The problem occurs constantly. The problem has not changed since onset.Pertinent negatives include no chest pain, no abdominal pain, no headaches and no shortness of breath. Nothing aggravates the symptoms. Nothing relieves the symptoms. She has tried nothing for the symptoms. The treatment provided no relief.  Took 2 ambien and a pain pill (unknown name) for her knee and became somnolent and EMS was called.  No SI or HI     Home Medications Prior to Admission medications   Medication Sig Start Date End Date Taking? Authorizing Provider  B Complex-C (B-COMPLEX WITH VITAMIN C) tablet Take 1 tablet by mouth daily. 08/29/23   Plotnikov, Georgina Quint, MD  FLUoxetine (PROZAC) 10 MG capsule Take 1 capsule (10 mg total) by mouth daily. 08/29/23   Plotnikov, Georgina Quint, MD  OVER THE COUNTER MEDICATION Take 1 capsule by mouth daily. [employer] Mushroom for memory and focus    [provider]  potassium chloride SA (KLOR-CON M) 20 MEQ tablet Take 1 tablet (20 mEq total) by mouth 2 (two) times daily. 10/23/22   Dione Booze, MD      Allergies    Codeine    Review of Systems   Review of Systems  Respiratory:  Negative for shortness of breath.   Cardiovascular:  Negative for chest pain.  Gastrointestinal:  Negative for abdominal pain.  Neurological:  Negative for headaches.  Psychiatric/Behavioral:  Negative for hallucinations and suicidal ideas.   All other systems reviewed and are negative.   Physical Exam Updated Vital Signs BP 130/65   Pulse 70   Temp 97.8 F (36.6 C) (Oral)   Resp 17   Ht 5'  2" (1.575 m)   Wt 70.3 kg   SpO2 99%   BMI 28.35 kg/m  Physical Exam Vitals and nursing note reviewed.  Constitutional:      General: She is not in acute distress.    Appearance: She is well-developed.  HENT:     Head: Normocephalic and atraumatic.  Eyes:     Pupils: Pupils are equal, round, and reactive to light.  Cardiovascular:     Rate and Rhythm: Normal rate and regular rhythm.     Pulses: Normal pulses.     Heart sounds: Normal heart sounds.  Pulmonary:     Effort: Pulmonary effort is normal. No respiratory distress.     Breath sounds: Normal breath sounds.  Abdominal:     General: Bowel sounds are normal. There is no distension.     Palpations: Abdomen is soft.     Tenderness: There is no abdominal tenderness. There is no guarding or rebound.  Musculoskeletal:        General: Normal range of motion.     Cervical back: Neck supple.  Skin:    General: Skin is warm and dry.     Capillary Refill: Capillary refill takes less than 2 seconds.     Findings: No erythema or rash.  Neurological:     General: No focal deficit present.     Deep Tendon Reflexes: Reflexes normal.  Psychiatric:        Mood and Affect: Mood normal.        Thought Content: Thought content does not include homicidal or suicidal ideation. Thought content does not include homicidal or suicidal plan.     ED Results / Procedures / Treatments   Labs (all labs ordered are listed, but only abnormal results are displayed) Results for orders placed or performed during the hospital encounter of 12/12/23  Comprehensive metabolic panel   Collection Time: 12/12/23 11:14 PM  Result Value Ref Range   Sodium 138 135 - 145 mmol/L   Potassium 3.5 3.5 - 5.1 mmol/L   Chloride 104 98 - 111 mmol/L   CO2 24 22 - 32 mmol/L   Glucose, Bld 99 70 - 99 mg/dL   BUN 20 8 - 23 mg/dL   Creatinine, Ser 5.36 0.44 - 1.00 mg/dL   Calcium 8.9 8.9 - 64.4 mg/dL   Total Protein 6.2 (L) 6.5 - 8.1 g/dL   Albumin 3.8 3.5 - 5.0  g/dL   AST 26 15 - 41 U/L   ALT 16 0 - 44 U/L   Alkaline Phosphatase 55 38 - 126 U/L   Total Bilirubin 0.4 0.0 - 1.2 mg/dL   GFR, Estimated >03 >47 mL/min   Anion gap 10 5 - 15  CBC WITH DIFFERENTIAL   Collection Time: 12/12/23 11:14 PM  Result Value Ref Range   WBC 4.8 4.0 - 10.5 K/uL   RBC 3.56 (L) 3.87 - 5.11 MIL/uL   Hemoglobin 11.2 (L) 12.0 - 15.0 g/dL   HCT 42.5 (L) 95.6 - 38.7 %   MCV 99.2 80.0 - 100.0 fL   MCH 31.5 26.0 - 34.0 pg   MCHC 31.7 30.0 - 36.0 g/dL   RDW 56.4 33.2 - 95.1 %   Platelets 235 150 - 400 K/uL   nRBC 0.0 0.0 - 0.2 %   Neutrophils Relative % 49 %   Neutro Abs 2.3 1.7 - 7.7 K/uL   Lymphocytes Relative 36 %   Lymphs Abs 1.7 0.7 - 4.0 K/uL   Monocytes Relative 10 %   Monocytes Absolute 0.5 0.1 - 1.0 K/uL   Eosinophils Relative 4 %   Eosinophils Absolute 0.2 0.0 - 0.5 K/uL   Basophils Relative 1 %   Basophils Absolute 0.0 0.0 - 0.1 K/uL   Immature Granulocytes 0 %   Abs Immature Granulocytes 0.01 0.00 - 0.07 K/uL  Salicylate level   Collection Time: 12/12/23 11:16 PM  Result Value Ref Range   Salicylate Lvl <7.0 (L) 7.0 - 30.0 mg/dL  Acetaminophen level   Collection Time: 12/12/23 11:16 PM  Result Value Ref Range   Acetaminophen (Tylenol), Serum <10 (L) 10 - 30 ug/mL  Ethanol   Collection Time: 12/12/23 11:16 PM  Result Value Ref Range   Alcohol, Ethyl (B) <10 <10 mg/dL  CBG monitoring, ED   Collection Time: 12/12/23 11:19 PM  Result Value Ref Range   Glucose-Capillary 94 70 - 99 mg/dL   Comment 1 Notify RN   Urine rapid drug screen (hosp performed)   Collection Time: 12/13/23 12:08 AM  Result Value Ref Range   Opiates NONE DETECTED NONE DETECTED   Cocaine NONE DETECTED NONE DETECTED   Benzodiazepines NONE DETECTED NONE DETECTED   Amphetamines NONE DETECTED NONE DETECTED   Tetrahydrocannabinol NONE DETECTED NONE DETECTED   Barbiturates NONE DETECTED NONE DETECTED   No results found.  Radiology No results  found.  Procedures Procedures    Medications  Ordered in ED Medications  naloxone [facility]) injection 0.4 mg (0 mg Intravenous Hold 12/12/23 2322)    ED Course/ Medical Decision Making/ A&P                                 Medical Decision Making Patient took 2 ambien and became sleepy and EMS was called.    Amount and/or Complexity of Data Reviewed Independent Historian: EMS    Details: See above  External Data Reviewed: notes.    Details: Previous notes  Labs: ordered.    Details: UDS is negative.  Alcohol is negative.  Normal white count 4.8, hemoglobin low 11.2, normal platelets.  Normal sodium 138, normal potassium, normal creatinine 0.69, normal LFTs   Risk Prescription drug management. Risk Details: Patient is now awake and alert.  She denies SI or HI.  PO challenged and ambulated.  Was informed not to take extra medications again.      Final Clinical Impression(s) / ED Diagnoses Final diagnoses:  Accidental overdose, initial encounter    I have reviewed the triage vital signs and the nursing notes. Pertinent labs & imaging results that were available during my care of the patient were reviewed by me and considered in my medical decision making (see chart for details). After history, exam, and medical workup I feel the patient has been appropriately medically screened and is safe for discharge home. Pertinent diagnoses were discussed with the patient. Patient was given return precautions.    Rx / DC Orders ED Discharge Orders     None         Venice Marcucci, MD 12/13/23 [phone]

## 2023-12-13 NOTE — ED Notes (Signed)
 Pt alert and oriented, conversing with friend at bedside. 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 Pt informed of poison controls request to observe for a total of 6 hrs. MD made aware.

## 2024-02-16 ENCOUNTER — Other Ambulatory Visit: Payer: Self-pay | Admitting: Internal Medicine

## 2024-02-16 DIAGNOSIS — R5383 Other fatigue: Secondary | ICD-10-CM

## 2024-02-16 DIAGNOSIS — F32A Depression, unspecified: Secondary | ICD-10-CM

## 2024-02-16 DIAGNOSIS — R413 Other amnesia: Secondary | ICD-10-CM

## 2024-07-03 ENCOUNTER — Ambulatory Visit

## 2024-08-16 ENCOUNTER — Other Ambulatory Visit: Payer: Self-pay | Admitting: Internal Medicine

## 2024-08-16 DIAGNOSIS — F32A Depression, unspecified: Secondary | ICD-10-CM

## 2024-08-16 DIAGNOSIS — R413 Other amnesia: Secondary | ICD-10-CM

## 2024-08-16 DIAGNOSIS — R5383 Other fatigue: Secondary | ICD-10-CM

## 2024-08-27 ENCOUNTER — Ambulatory Visit

## 2024-08-27 ENCOUNTER — Ambulatory Visit: Admitting: Internal Medicine

## 2024-09-26 ENCOUNTER — Ambulatory Visit

## 2024-09-26 VITALS — Ht 62.5 in | Wt 155.0 lb

## 2024-09-26 DIAGNOSIS — Z0000 Encounter for general adult medical examination without abnormal findings: Secondary | ICD-10-CM | POA: Diagnosis not present

## 2024-09-26 DIAGNOSIS — K635 Polyp of colon: Secondary | ICD-10-CM | POA: Diagnosis not present

## 2024-09-26 NOTE — Patient Instructions (Addendum)
 [name],  Thank you for taking the time for your Medicare Wellness Visit. I appreciate your continued commitment to your health goals. Please review the care plan we discussed, and feel free to reach out if I can assist you further.  Please note that Annual Wellness Visits do not include a physical exam. Some assessments may be limited, especially if the visit was conducted virtually. If needed, we may recommend an in-person follow-up with your provider.  Ongoing Care Seeing your primary care provider every 3 to 6 months helps us  monitor your health and provide consistent, personalized care.   Referrals If a referral was made during today's visit and you haven't received any updates within two weeks, please contact the referred provider directly to check on the status.  Recommended Screenings:  Health Maintenance  Topic Date Due   Pneumococcal Vaccine for age over 53 (1 of 1 - PCV) Never done   Zoster (Shingles) Vaccine (2 of 2) 01/21/2022   Colon Cancer Screening  07/28/2022   Flu Shot  05/16/2024   COVID-19 Vaccine (7 - 2025-26 season) 06/16/2024   Medicare Annual Wellness Visit  09/26/2025   DTaP/Tdap/Td vaccine (2 - Td or Tdap) 05/31/2032   Osteoporosis screening with Bone Density Scan  Completed   Hepatitis C Screening  Completed   Meningitis B Vaccine  Aged Out   Breast Cancer Screening  Discontinued       09/26/2024   12:48 PM  Advanced Directives  Does Patient Have a Medical Advance Directive? No  Would patient like information on creating a medical advance directive? No - Patient declined    Vision: Annual vision screenings are recommended for early detection of glaucoma, cataracts, and diabetic retinopathy. These exams can also reveal signs of chronic conditions such as diabetes and high blood pressure.  Dental: Annual dental screenings help detect early signs of oral cancer, gum disease, and other conditions linked to overall health, including heart disease and  diabetes.

## 2024-09-26 NOTE — Progress Notes (Signed)
 Chief Complaint  Patient presents with   Medicare Wellness     Subjective:  Please attest and cosign this visit due to patients primary care provider not being in the office at the time the visit was completed.  (Pt of Dr A. Plotnikov)   Natalie Fletcher is a 73 y.o. female who presents for a Medicare Annual Wellness Visit.  Visit info / Clinical Intake: Medicare Wellness Visit Type:: Subsequent Annual Wellness Visit Persons participating in visit and providing information:: patient Medicare Wellness Visit Mode:: Telephone If telephone:: video declined Since this visit was completed virtually, some vitals may be partially provided or unavailable. Missing vitals are due to the limitations of the virtual format.: Documented vitals are patient reported If Telephone or Video please confirm:: I connected with patient using audio/video enable telemedicine. I verified patient identity with two identifiers, discussed telehealth limitations, and patient agreed to proceed. Patient Location:: Home Provider Location:: Office Interpreter Needed?: No Pre-visit prep was completed: yes AWV questionnaire completed by patient prior to visit?: no Living arrangements:: lives with spouse/significant other Patient's Overall Health Status Rating: good Typical amount of pain: none Does pain affect daily life?: no Are you currently prescribed opioids?: no  Dietary Habits and Nutritional Risks How many meals a day?: 3 Eats fruit and vegetables daily?: yes Most meals are obtained by: preparing own meals In the last 2 weeks, have you had any of the following?: none Diabetic:: no  Functional Status Activities of Daily Living (to include ambulation/medication): Independent Ambulation: Independent with device- listed below Home Assistive Devices/Equipment: Eyeglasses; Contact lenses Medication Administration: Independent Home Management (perform basic housework or laundry): Independent Manage your own  finances?: yes Primary transportation is: driving Concerns about vision?: no *vision screening is required for WTM* Concerns about hearing?: no  Fall Screening Falls in the past year?: 0 Number of falls in past year: 0 Was there an injury with Fall?: 0 Fall Risk Category Calculator: 0 Patient Fall Risk Level: Low Fall Risk  Fall Risk Patient at Risk for Falls Due to: No Fall Risks Fall risk Follow up: Falls evaluation completed; Falls prevention discussed  Home and Transportation Safety: All rugs have non-skid backing?: N/A, no rugs All stairs or steps have railings?: (!) no (outside) Grab bars in the bathtub or shower?: yes Have non-skid surface in bathtub or shower?: yes Good home lighting?: yes Regular seat belt use?: yes Hospital stays in the last year:: no  Cognitive Assessment Difficulty concentrating, remembering, or making decisions? : no Will 6CIT or Mini Cog be Completed: yes What year is it?: 0 points What month is it?: 0 points Give patient an address phrase to remember (5 components): [address] About what time is it?: 0 points Count backwards from 20 to 1: 0 points Say the months of the year in reverse: 0 points Repeat the address phrase from earlier: 0 points 6 CIT Score: 0 points  Advance Directives (For Healthcare) Does Patient Have a Medical Advance Directive?: No Type of Advance Directive: Living will Would patient like information on creating a medical advance directive?: No - Patient declined  Reviewed/Updated  Reviewed/Updated: Reviewed All (Medical, Surgical, Family, Medications, Allergies, Care Teams, Patient Goals)    Allergies (verified) Codeine   Current Medications (verified) Outpatient Encounter Medications as of 09/26/2024  Medication Sig   B Complex-C (B-COMPLEX WITH VITAMIN C) tablet Take 1 tablet by mouth daily.   FLUoxetine  (PROZAC ) 10 MG capsule TAKE 1 CAPSULE BY MOUTH EVERY DAY   OVER THE  COUNTER MEDICATION Take 1 capsule  by mouth daily. [employer] Mushroom for memory and focus   potassium chloride  SA (KLOR-CON  M) 20 MEQ tablet Take 1 tablet (20 mEq total) by mouth 2 (two) times daily.   No facility-administered encounter medications on file as of 09/26/2024.    History: Past Medical History:  Diagnosis Date   Anemia    Arthritis    Depression    GERD (gastroesophageal reflux disease)    Seizures (HCC)    Stroke [facility])    Past Surgical History:  Procedure Laterality Date   COLONOSCOPY  12/2007   PARTIAL KNEE ARTHROPLASTY  05/20/2012   Procedure: UNICOMPARTMENTAL KNEE;  Surgeon: Garnette JONETTA Raman, MD;  Location: [facility] OR;  Service: Orthopedics;  Laterality: Right;  Uni Medial Compartmental Right Knee    Family History  Problem Relation Age of Onset   Kidney cancer Mother 55       kidney ca   COPD Father    Kidney cancer Maternal Grandmother 19       kidney ca   Breast cancer Maternal Aunt    Colon cancer Neg Hx    Rectal cancer Neg Hx    Stomach cancer Neg Hx    Social History   Occupational History    Employer: [employer]  Tobacco Use   Smoking status: Never   Smokeless tobacco: Never  Vaping Use   Vaping status: Never Used  Substance and Sexual Activity   Alcohol use: Yes    Alcohol/week: 1.0 standard drink of alcohol    Types: 1 Glasses of wine per week    Comment: occasionally   Drug use: No   Sexual activity: Not Currently    Birth control/protection: Post-menopausal   Tobacco Counseling Counseling given: Not Answered  SDOH Screenings   Food Insecurity: No Food Insecurity (09/26/2024)  Housing: Unknown (09/26/2024)  Transportation Needs: No Transportation Needs (09/26/2024)  Utilities: Not At Risk (09/26/2024)  Alcohol Screen: Low Risk (08/27/2023)  Depression (PHQ2-9): Low Risk (09/26/2024)  Financial Resource Strain: Low Risk (08/27/2023)  Physical Activity: Inactive (09/26/2024)  Social Connections: Socially Integrated (09/26/2024)  Stress: Stress Concern Present  (09/26/2024)  Tobacco Use: Low Risk (09/26/2024)  Health Literacy: Adequate Health Literacy (09/26/2024)   See flowsheets for full screening details  Depression Screen PHQ 2 & 9 Depression Scale- Over the past 2 weeks, how often have you been bothered by any of the following problems? Little interest or pleasure in doing things: 0 Feeling down, depressed, or hopeless (PHQ Adolescent also includes...irritable): 0 PHQ-2 Total Score: 0 Trouble falling or staying asleep, or sleeping too much: 0 Feeling tired or having little energy: 0 Poor appetite or overeating (PHQ Adolescent also includes...weight loss): 0 Feeling bad about yourself - or that you are a failure or have let yourself or your family down: 0 Trouble concentrating on things, such as reading the newspaper or watching television (PHQ Adolescent also includes...like school work): 0 Moving or speaking so slowly that other people could have noticed. Or the opposite - being so fidgety or restless that you have been moving around a lot more than usual: 0 Thoughts that you would be better off dead, or of hurting yourself in some way: 0 PHQ-9 Total Score: 0 If you checked off any problems, how difficult have these problems made it for you to do your work, take care of things at home, or get along with other people?: Not difficult at all  Depression Treatment Depression Interventions/Treatment : EYV7-0 Score <4  Follow-up Not Indicated     Goals Addressed               This Visit's Progress     Patient Stated (pt-stated)        Patient stated that she plans to walk more once legs heal             Objective:    Today's Vitals   09/26/24 1247  Weight: 155 lb (70.3 kg)  Height: 5' 2.5 (1.588 m)   Body mass index is 27.9 kg/m.  Hearing/Vision screen Hearing Screening - Comments:: Denies hearing difficulties   Vision Screening - Comments:: Wears rx glasses &amp; contact lenses- up to date with routine eye exams with  [facility] Immunizations and Health Maintenance Health Maintenance  Topic Date Due   Pneumococcal Vaccine: 50+ Years (1 of 1 - PCV) Never done   Zoster Vaccines- Shingrix (2 of 2) 01/21/2022   Colonoscopy  07/28/2022   Influenza Vaccine  05/16/2024   COVID-19 Vaccine (7 - 2025-26 season) 06/16/2024   Medicare Annual Wellness (AWV)  09/26/2025   DTaP/Tdap/Td (2 - Td or Tdap) 05/31/2032   Bone Density Scan  Completed   Hepatitis C Screening  Completed   Meningococcal B Vaccine  Aged Out   Mammogram  Discontinued        Assessment/Plan:  This is a routine wellness examination for Natalie Fletcher.  Colonoscopy status: Ordered today (Dr Leigh) - h/o colon polyps  Patient Care Team: Plotnikov, Karlynn GAILS, MD as PCP - General Rubie Kemps, MD as Consulting Physician (Orthopedic Surgery) Debrah Lamar BIRCH, MD (Inactive) as Consulting Physician (Gastroenterology) Austin Ned, MD as Consulting Physician (Obstetrics and Gynecology) Octavia, Charlie Hamilton, MD as Consulting Physician (Ophthalmology)  I have personally reviewed and noted the following in the patients chart:   Medical and social history Use of alcohol, tobacco or illicit drugs  Current medications and supplements including opioid prescriptions. Functional ability and status Nutritional status Physical activity Advanced directives List of other physicians Hospitalizations, surgeries, and ER visits in previous 12 months Vitals Screenings to include cognitive, depression, and falls Referrals and appointments  Orders Placed This Encounter  Procedures   Ambulatory referral to Gastroenterology    Referral Priority:   Routine    Referral Type:   Consultation    Referral Reason:   Specialty Services Required    Referred to Provider:   Leigh Elspeth SQUIBB, MD    Number of Visits Requested:   1   In addition, I have reviewed and discussed with patient certain preventive protocols, quality metrics, and best practice  recommendations. A written personalized care plan for preventive services as well as general preventive health recommendations were provided to patient.   Verdie CHRISTELLA Saba, CMA   09/26/2024   Return in 1 year (on 09/26/2025).  After Visit Summary: (MyChart) Due to this being a telephonic visit, the after visit summary with patients personalized plan was offered to patient via MyChart   Nurse Notes: Appointment(s) made: (scheduled CPE appt w/PCP for 11/2024)

## 2024-11-18 ENCOUNTER — Encounter: Admitting: Internal Medicine

## 2024-12-31 ENCOUNTER — Encounter: Admitting: Internal Medicine

## 2025-10-02 ENCOUNTER — Ambulatory Visit
